# Patient Record
Sex: Female | Born: 1965
Health system: Southern US, Community
[De-identification: ages and names within clinical notes are randomized; demographics above are authoritative.]

## PROBLEM LIST (undated history)

## (undated) DIAGNOSIS — D259 Leiomyoma of uterus, unspecified: Secondary | ICD-10-CM

## (undated) DIAGNOSIS — N809 Endometriosis, unspecified: Secondary | ICD-10-CM

## (undated) DIAGNOSIS — A048 Other specified bacterial intestinal infections: Secondary | ICD-10-CM

## (undated) DIAGNOSIS — E785 Hyperlipidemia, unspecified: Secondary | ICD-10-CM

## (undated) DIAGNOSIS — M1990 Unspecified osteoarthritis, unspecified site: Secondary | ICD-10-CM

## (undated) DIAGNOSIS — K219 Gastro-esophageal reflux disease without esophagitis: Secondary | ICD-10-CM

## (undated) DIAGNOSIS — K635 Polyp of colon: Secondary | ICD-10-CM

## (undated) HISTORY — PX: LAPAROSCOPY: SHX197

## (undated) HISTORY — DX: Gastro-esophageal reflux disease without esophagitis: K21.9

## (undated) HISTORY — DX: Hyperlipidemia, unspecified: E78.5

## (undated) HISTORY — DX: Leiomyoma of uterus, unspecified: D25.9

## (undated) HISTORY — DX: Polyp of colon: K63.5

## (undated) HISTORY — PX: ABDOMINAL HYSTERECTOMY: SUR658

## (undated) HISTORY — DX: Endometriosis, unspecified: N80.9

## (undated) HISTORY — DX: Other specified bacterial intestinal infections: A04.8

## (undated) HISTORY — PX: TONSILLECTOMY: SUR1361

---

## 1998-01-11 ENCOUNTER — Emergency Department (HOSPITAL_COMMUNITY): Admission: EM | Admit: 1998-01-11 | Discharge: 1998-01-11 | Payer: Self-pay | Admitting: *Deleted

## 1999-12-26 ENCOUNTER — Other Ambulatory Visit: Admission: RE | Admit: 1999-12-26 | Discharge: 1999-12-26 | Payer: Self-pay | Admitting: *Deleted

## 2003-08-04 ENCOUNTER — Encounter: Admission: RE | Admit: 2003-08-04 | Discharge: 2003-08-04 | Payer: Self-pay | Admitting: Family Medicine

## 2005-07-30 ENCOUNTER — Encounter: Payer: Self-pay | Admitting: Emergency Medicine

## 2005-08-14 ENCOUNTER — Other Ambulatory Visit: Admission: RE | Admit: 2005-08-14 | Discharge: 2005-08-14 | Payer: Self-pay | Admitting: *Deleted

## 2006-09-22 ENCOUNTER — Encounter: Admission: RE | Admit: 2006-09-22 | Discharge: 2006-09-22 | Payer: Self-pay | Admitting: *Deleted

## 2006-10-07 ENCOUNTER — Other Ambulatory Visit: Admission: RE | Admit: 2006-10-07 | Discharge: 2006-10-07 | Payer: Self-pay | Admitting: *Deleted

## 2007-11-23 ENCOUNTER — Encounter: Admission: RE | Admit: 2007-11-23 | Discharge: 2007-11-23 | Payer: Self-pay | Admitting: Family Medicine

## 2008-07-28 ENCOUNTER — Other Ambulatory Visit: Admission: RE | Admit: 2008-07-28 | Discharge: 2008-07-28 | Payer: Self-pay | Admitting: Family Medicine

## 2009-02-15 ENCOUNTER — Encounter: Admission: RE | Admit: 2009-02-15 | Discharge: 2009-02-15 | Payer: Self-pay | Admitting: Family Medicine

## 2009-10-10 ENCOUNTER — Other Ambulatory Visit: Admission: RE | Admit: 2009-10-10 | Discharge: 2009-10-10 | Payer: Self-pay | Admitting: Family Medicine

## 2010-02-18 ENCOUNTER — Encounter: Admission: RE | Admit: 2010-02-18 | Discharge: 2010-02-18 | Payer: Self-pay | Admitting: Obstetrics & Gynecology

## 2010-04-22 ENCOUNTER — Encounter: Payer: Self-pay | Admitting: Family Medicine

## 2010-10-25 ENCOUNTER — Other Ambulatory Visit (HOSPITAL_COMMUNITY)
Admission: RE | Admit: 2010-10-25 | Discharge: 2010-10-25 | Disposition: A | Discharge status: Home / Self Care | Payer: 59 | Source: Ambulatory Visit | Attending: Family Medicine | Admitting: Family Medicine

## 2010-10-25 ENCOUNTER — Other Ambulatory Visit: Payer: Self-pay | Admitting: Family Medicine

## 2010-10-25 DIAGNOSIS — Z124 Encounter for screening for malignant neoplasm of cervix: Secondary | ICD-10-CM | POA: Insufficient documentation

## 2010-10-25 DIAGNOSIS — Z1159 Encounter for screening for other viral diseases: Secondary | ICD-10-CM | POA: Insufficient documentation

## 2011-02-28 ENCOUNTER — Other Ambulatory Visit: Payer: Self-pay | Admitting: Obstetrics & Gynecology

## 2011-02-28 DIAGNOSIS — Z1231 Encounter for screening mammogram for malignant neoplasm of breast: Secondary | ICD-10-CM

## 2011-03-07 ENCOUNTER — Ambulatory Visit
Admission: RE | Admit: 2011-03-07 | Discharge: 2011-03-07 | Disposition: A | Discharge status: Home / Self Care | Payer: 59 | Source: Ambulatory Visit | Attending: Obstetrics & Gynecology | Admitting: Obstetrics & Gynecology

## 2011-03-07 DIAGNOSIS — Z1231 Encounter for screening mammogram for malignant neoplasm of breast: Secondary | ICD-10-CM

## 2011-03-11 ENCOUNTER — Other Ambulatory Visit: Payer: Self-pay | Admitting: Obstetrics & Gynecology

## 2011-03-11 ENCOUNTER — Other Ambulatory Visit: Payer: Self-pay | Admitting: Family Medicine

## 2011-03-11 ENCOUNTER — Other Ambulatory Visit: Payer: Self-pay | Admitting: *Deleted

## 2011-03-11 DIAGNOSIS — R928 Other abnormal and inconclusive findings on diagnostic imaging of breast: Secondary | ICD-10-CM

## 2011-03-13 ENCOUNTER — Ambulatory Visit
Admission: RE | Admit: 2011-03-13 | Discharge: 2011-03-13 | Disposition: A | Discharge status: Home / Self Care | Payer: 59 | Source: Ambulatory Visit | Attending: Family Medicine | Admitting: Family Medicine

## 2011-03-13 DIAGNOSIS — R928 Other abnormal and inconclusive findings on diagnostic imaging of breast: Secondary | ICD-10-CM

## 2011-08-15 ENCOUNTER — Other Ambulatory Visit: Payer: Self-pay | Admitting: Family Medicine

## 2011-08-15 DIAGNOSIS — N6009 Solitary cyst of unspecified breast: Secondary | ICD-10-CM

## 2011-09-03 ENCOUNTER — Other Ambulatory Visit: Payer: 59

## 2011-11-21 ENCOUNTER — Ambulatory Visit
Admission: RE | Admit: 2011-11-21 | Discharge: 2011-11-21 | Disposition: A | Discharge status: Home / Self Care | Payer: 59 | Source: Ambulatory Visit | Attending: Family Medicine | Admitting: Family Medicine

## 2011-11-21 DIAGNOSIS — N6009 Solitary cyst of unspecified breast: Secondary | ICD-10-CM

## 2012-02-19 ENCOUNTER — Other Ambulatory Visit: Payer: Self-pay | Admitting: Gastroenterology

## 2012-06-03 ENCOUNTER — Other Ambulatory Visit: Payer: Self-pay | Admitting: Obstetrics & Gynecology

## 2012-06-03 ENCOUNTER — Other Ambulatory Visit: Payer: Self-pay | Admitting: Family Medicine

## 2012-06-03 DIAGNOSIS — N6002 Solitary cyst of left breast: Secondary | ICD-10-CM

## 2012-06-15 ENCOUNTER — Other Ambulatory Visit: Payer: 59

## 2012-06-17 ENCOUNTER — Ambulatory Visit
Admission: RE | Admit: 2012-06-17 | Discharge: 2012-06-17 | Disposition: A | Discharge status: Home / Self Care | Payer: 59 | Source: Ambulatory Visit | Attending: Family Medicine | Admitting: Family Medicine

## 2012-06-17 DIAGNOSIS — N6002 Solitary cyst of left breast: Secondary | ICD-10-CM

## 2014-03-31 IMAGING — MG MM DIAGNOSTIC BILATERAL
5 series · 5 of 5 positions shown · non-contrast
Comparison: [DATE] [DATE], [DATE], [DATE] [DATE], [DATE], [DATE] [DATE], [DATE],
[DATE] [DATE], [DATE]

CLINICAL DATA: Follow-up left breast

DIGITAL DIAGNOSTIC BILATERAL MAMMOGRAM WITH CAD AND LEFT BREAST
ULTRASOUND:

[R CC (1 of 2)]
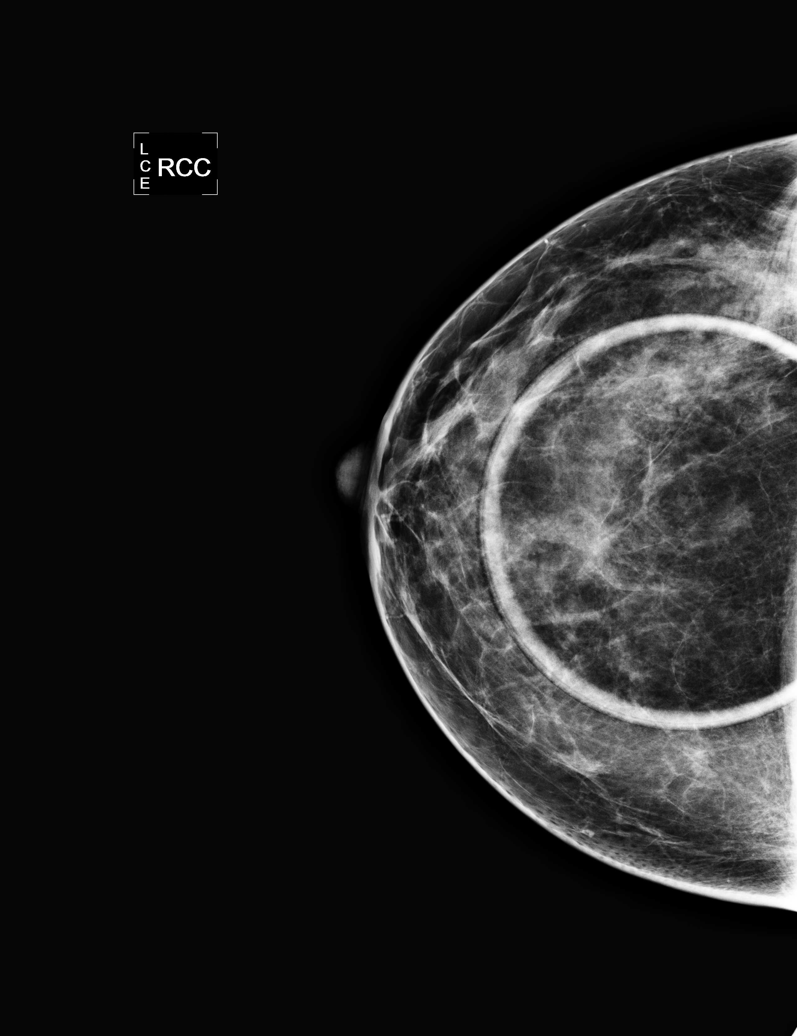

[R CC (2 of 2)]
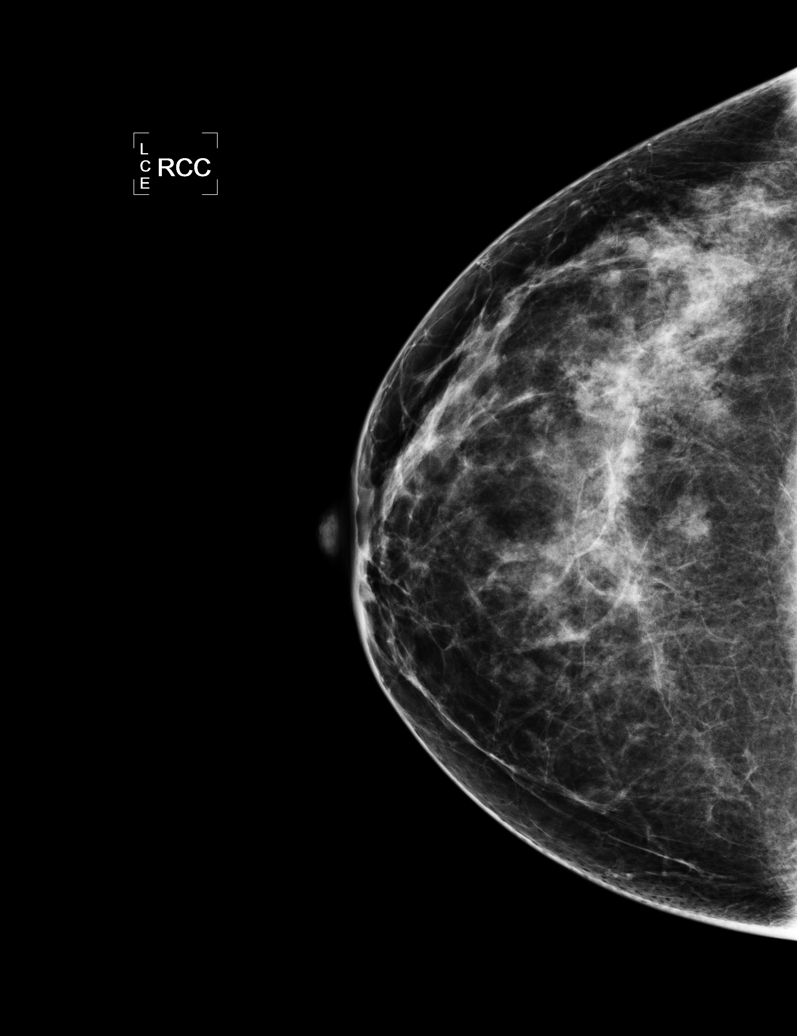

[L CC]
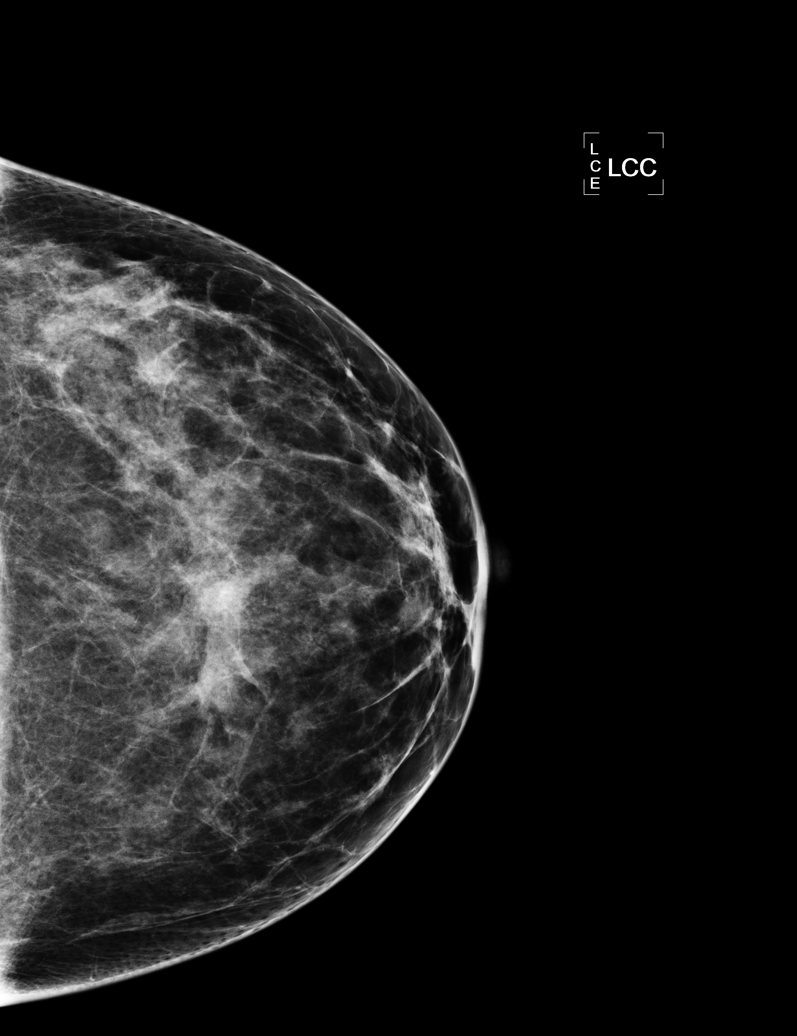

[L MLO]
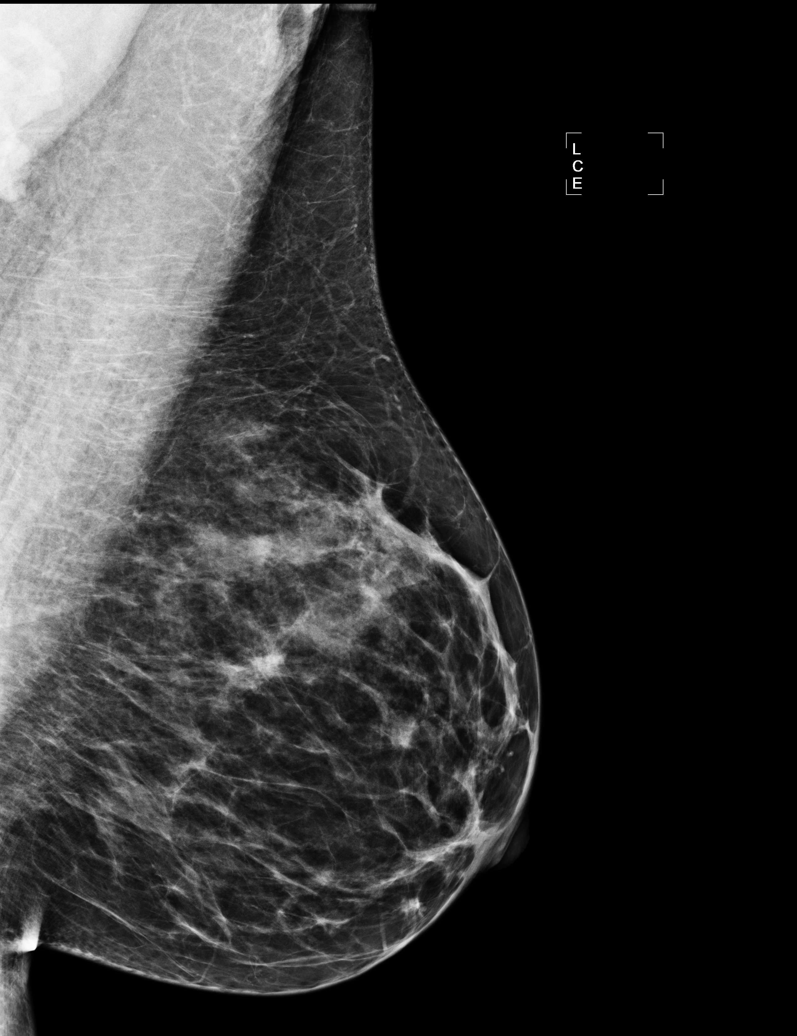

[R MLO]
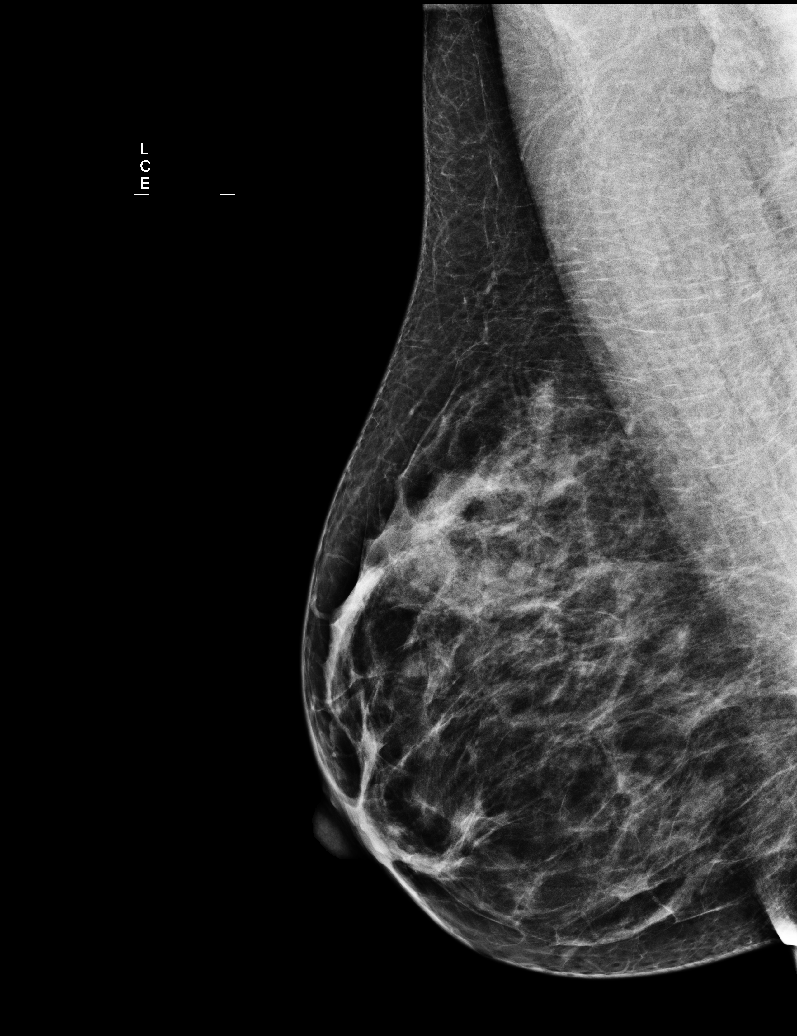

[5 of 5 positions shown; findings below may reference images not displayed]

FINDINGS: ACR Breast Density Category heterogeneously dense

CC and MLO views of bilateral breasts, spot compression right CC
view are submitted.  There is questioned asymmetry in central
posterior right breast which does not persist on spot compression
right CC view.  No suspicious abnormality is identified in both
breasts.

Mammographic images were processed with CAD.

Ultrasound is performed, showing a cluster of cysts at the left
breast 12 o'clock 4 cm from nipple measuring maximum of 0.8 cm
unchanged compared to prior ultrasound November 21, 2011 and smaller
compared prior ultrasound March 13, 2011
IMPRESSION: Probable benign findings

RECOMMENDATION:
Follow-up ultrasound left breast in February 2013.  At which time,
the patient would have had 2-year follow-up of this area.

I have discussed the findings and recommendations with the patient.
Results were also provided in writing at the conclusion of the
visit.

BI-RADS CATEGORY 3:  Probably benign finding(s) - short interval
follow-up suggested.

## 2014-03-31 IMAGING — US US BREAST*L*
1 series · 4 of 4 positions shown · non-contrast
Comparison: [DATE] [DATE], [DATE], [DATE] [DATE], [DATE], [DATE] [DATE], [DATE],
[DATE] [DATE], [DATE]

CLINICAL DATA: Follow-up left breast

DIGITAL DIAGNOSTIC BILATERAL MAMMOGRAM WITH CAD AND LEFT BREAST
ULTRASOUND:

[Series 1: us breast*left* · 4 of 4 slices shown]
[im 1/4]
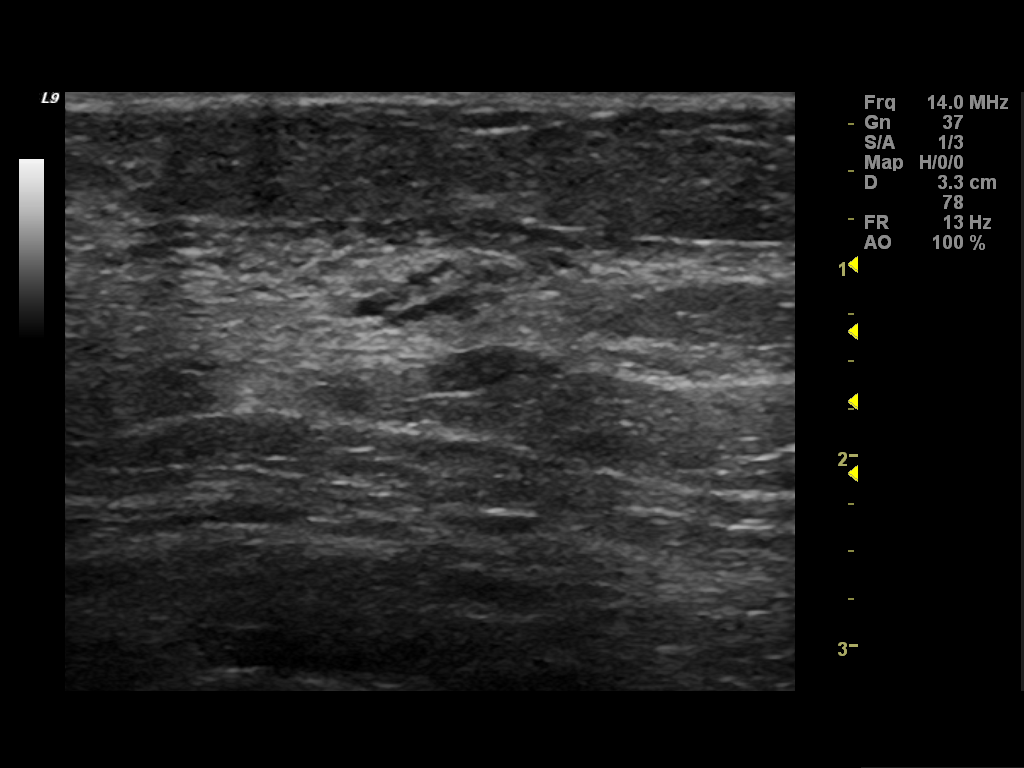
[im 2/4]
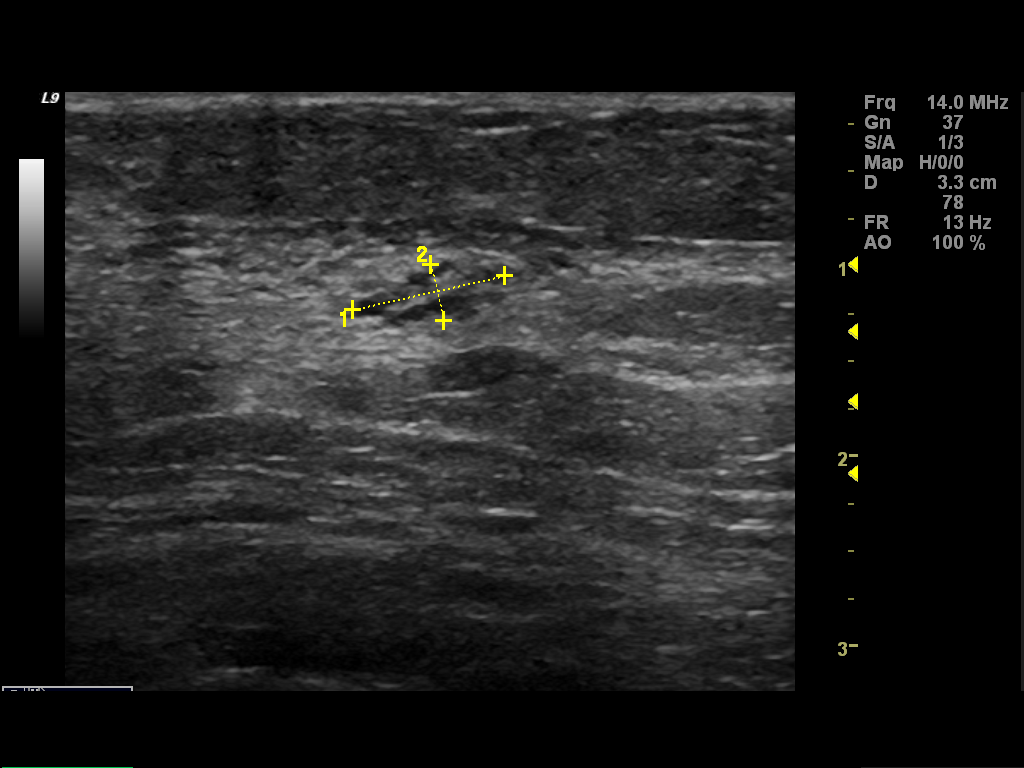
[im 3/4]
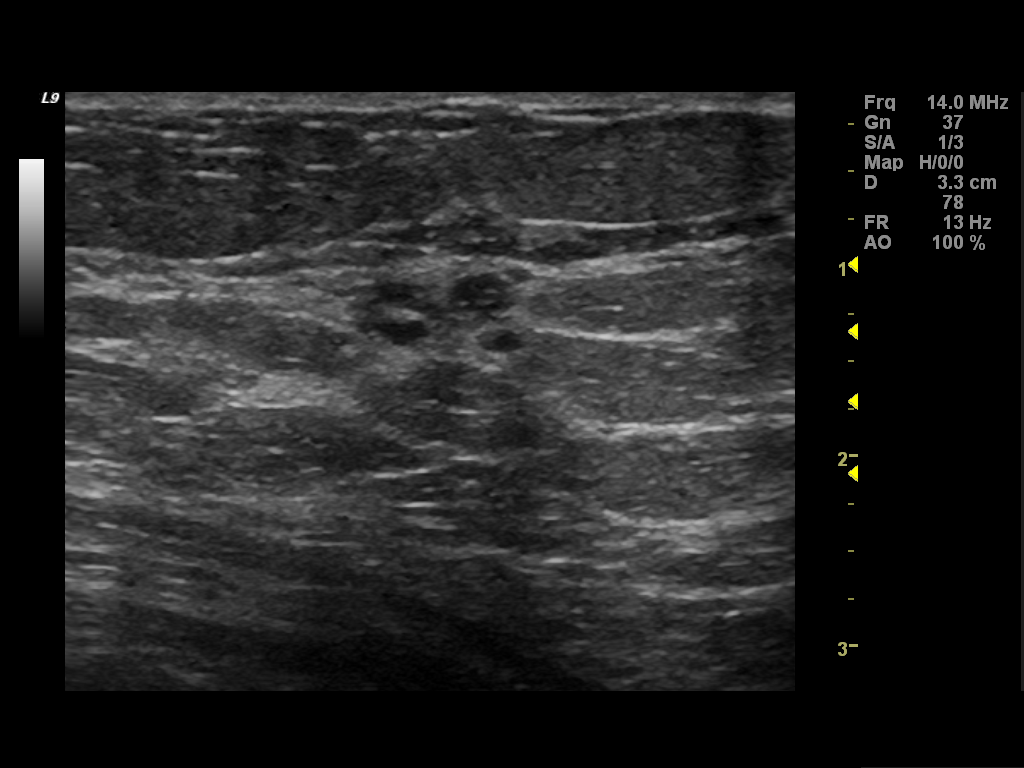
[im 4/4]
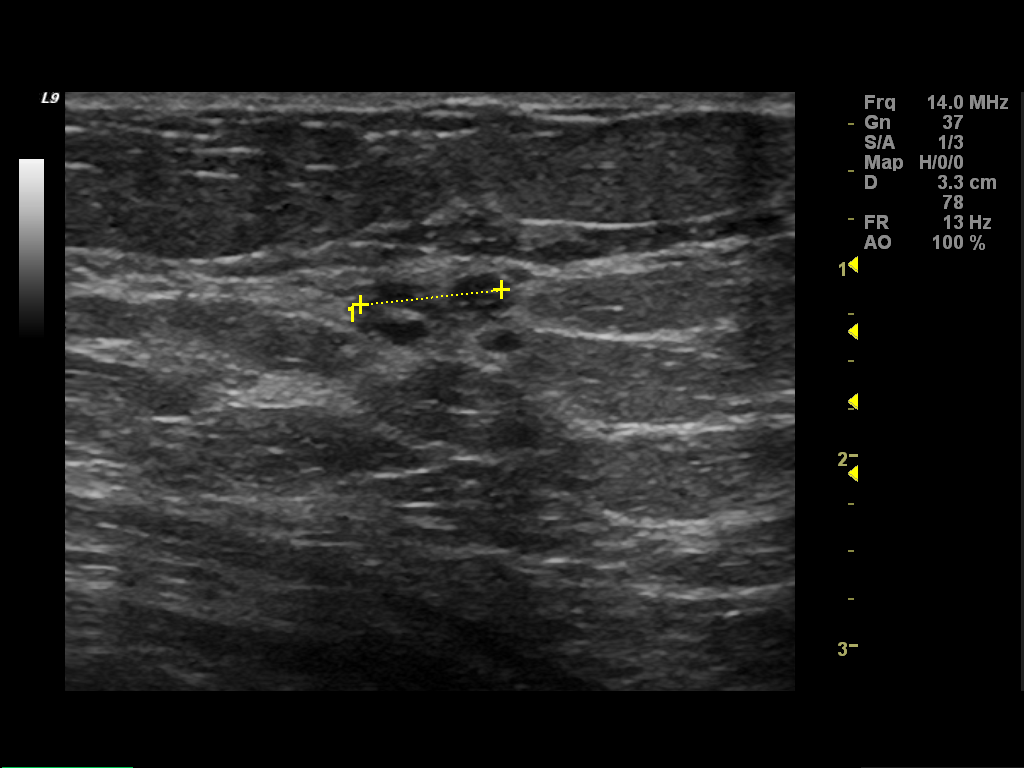

[4 of 4 positions shown; findings below may reference images not displayed]

FINDINGS: ACR Breast Density Category heterogeneously dense

CC and MLO views of bilateral breasts, spot compression right CC
view are submitted.  There is questioned asymmetry in central
posterior right breast which does not persist on spot compression
right CC view.  No suspicious abnormality is identified in both
breasts.

Mammographic images were processed with CAD.

Ultrasound is performed, showing a cluster of cysts at the left
breast 12 o'clock 4 cm from nipple measuring maximum of 0.8 cm
unchanged compared to prior ultrasound November 21, 2011 and smaller
compared prior ultrasound March 13, 2011
IMPRESSION: Probable benign findings

RECOMMENDATION:
Follow-up ultrasound left breast in February 2013.  At which time,
the patient would have had 2-year follow-up of this area.

I have discussed the findings and recommendations with the patient.
Results were also provided in writing at the conclusion of the
visit.

BI-RADS CATEGORY 3:  Probably benign finding(s) - short interval
follow-up suggested.

## 2017-06-18 ENCOUNTER — Encounter: Payer: Self-pay | Admitting: Physician Assistant

## 2017-07-14 ENCOUNTER — Encounter: Payer: Self-pay | Admitting: Physician Assistant

## 2017-07-14 ENCOUNTER — Ambulatory Visit (INDEPENDENT_AMBULATORY_CARE_PROVIDER_SITE_OTHER): Payer: BLUE CROSS/BLUE SHIELD | Admitting: Physician Assistant

## 2017-07-14 VITALS — BP 134/76 | HR 68 | Ht 62.5 in | Wt 204.0 lb

## 2017-07-14 DIAGNOSIS — Z8742 Personal history of other diseases of the female genital tract: Secondary | ICD-10-CM | POA: Diagnosis not present

## 2017-07-14 DIAGNOSIS — K219 Gastro-esophageal reflux disease without esophagitis: Secondary | ICD-10-CM

## 2017-07-14 DIAGNOSIS — Z86010 Personal history of colonic polyps: Secondary | ICD-10-CM | POA: Diagnosis not present

## 2017-07-14 DIAGNOSIS — Z1211 Encounter for screening for malignant neoplasm of colon: Secondary | ICD-10-CM

## 2017-07-14 DIAGNOSIS — Z800 Family history of malignant neoplasm of digestive organs: Secondary | ICD-10-CM | POA: Diagnosis not present

## 2017-07-14 MED ORDER — OMEPRAZOLE MAGNESIUM 20 MG PO TBEC: 40.0000 mg | DELAYED_RELEASE_TABLET | Freq: Every day | ORAL | 11 refills | Status: DC

## 2017-07-14 MED ORDER — NA SULFATE-K SULFATE-MG SULF 17.5-3.13-1.6 GM/177ML PO SOLN: 1.0000 | Freq: Once | ORAL | 0 refills | Status: AC

## 2017-07-14 NOTE — Patient Instructions (Addendum)
  If you are age 52 or younger, your body mass index should be between 19-25. Your Body mass index is 36.72 kg/m. If this is out of the aformentioned range listed, please consider follow up with your Primary Care Provider.   We have sent the following medications to your pharmacy for you to pick up at your convenience: [address], [place], [place].  1. Omeprazole 40 mg. 2. Suprep colonoscopy prep  We have provided you with anti-reflux information. You have been scheduled for an endoscopy and colonoscopy. Please follow the written instructions given to you at your visit today. Please pick up your prep supplies at the pharmacy within the next 1-3 days. If you use inhalers (even only as needed), please bring them with you on the day of your procedure. Your physician has requested that you go to www.startemmi.com and enter the access code given to you at your visit today. This web site gives a general overview about your procedure. However, you should still follow specific instructions given to you by our office regarding your preparation for the procedure.

## 2017-07-14 NOTE — Progress Notes (Signed)
Reviewed and agree with initial management plan. Awaiting records from [facility]. If there is no history of precancerous colon polyps a 10 year interval colonoscopy would be appropriate.   Pricilla Riffle. Fuller Plan, MD [facility]

## 2017-07-14 NOTE — Progress Notes (Signed)
Subjective:    Patient ID: [name], female    DOB: 25-Nov-1965, 52 y.o.   MRN: [id number]  HPI [name] is a pleasant 52 year old white female, new to GI today self-referred for evaluation of recent heartburn and reflux symptoms and to discuss follow-up colonoscopy. Patient had previously been seen by Dr. Teena Franco at [place] and says she had colonoscopy in 2013.  She was found to have a polyp, type unclear but believes she was told to follow-up in 5 years.  [name] She may have had one prior upper endoscopy many years ago done elsewhere. [name] history is pertinent for a paternal aunt with colon cancer deceased in her 14s. Patient has no current lower GI complaints, specifically no problems with changes in bowel habits diarrhea constipation melena or hematochezia. She has had problems with endometriosis and uterine fibroids.  She had an IUD placed in February to help with bleeding.  She had also been having some difficulty with headaches and was taking quite a bit of anti-inflammatories.  She says in March she began feeling ill with headache and nausea followed by pain in her left shoulder blade, and ongoing symptoms of heartburn and indigestion.  She says she has had the pain in the left shoulder blade off and on and it typically correlates with onset of reflux type symptoms.  She put herself on over-the-counter omeprazole 40 mg daily and says she is feeling better at this point but had several weeks of feeling miserable.  She is no longer having any nausea, has no complaints of dysphagia or odynophagia and no abdominal pain.  She also has stopped taking anti-inflammatories.  She is worried about her esophagus and all also about the left scapular and back pain in regards to her pancreas though she admits that this is been present off and on for several years.  Review of Systems Pertinent positive and negative review of systems were noted in the above HPI section.  All other review of systems was  otherwise negative.  Outpatient Encounter Medications as of 07/14/2017  Medication Sig  . aspirin EC 81 MG tablet Take 81 mg by mouth daily.  . Calcium Carbonate-Vit D-Min (CALCIUM 1200 PO) Take 1 tablet by mouth daily. 600 d  . Cholecalciferol (VITAMIN D-3) 5000 units TABS Take 1 tablet by mouth daily.  . cyanocobalamin 1000 MCG tablet Take 1,000 mcg by mouth daily.  Marland Kitchen levonorgestrel (MIRENA) 20 MCG/24HR IUD 1 each by Intrauterine route once.  . lovastatin (MEVACOR) 20 MG tablet Take 1 tablet by mouth daily.  . Multiple Vitamin (MULTIVITAMIN) tablet Take 1 tablet by mouth daily.  Marland Kitchen omeprazole (PRILOSEC OTC) 20 MG tablet Take 2 tablets (40 mg total) by mouth daily.  . Probiotic Product (PHILLIPS COLON HEALTH PO) Take 1 tablet by mouth daily.  . [DISCONTINUED] omeprazole (PRILOSEC OTC) 20 MG tablet Take 20 mg by mouth daily.  . Na Sulfate-K Sulfate-Mg Sulf 17.5-3.13-1.6 GM/177ML SOLN Take 1 kit by mouth once for 1 dose.   No facility-administered encounter medications on file as of 07/14/2017.    No Known Allergies Patient Active Problem List   Diagnosis Date Noted  . Hx of colonic polyp 07/14/2017  . Hx of endometriosis 07/14/2017   Social History   Socioeconomic History  . Marital status: Single    Spouse name: Not on file  . Number of children: 0  . Years of education: Not on file  . Highest education level: Not on file  Occupational History  .  Occupation: office admin  Social Needs  . Financial resource strain: Not on file  . Food insecurity:    Worry: Not on file    Inability: Not on file  . Transportation needs:    Medical: Not on file    Non-medical: Not on file  Tobacco Use  . Smoking status: Former Smoker    Types: Cigarettes    Last attempt to quit: 2005    Years since quitting: 14.2  . Smokeless tobacco: Never Used  Substance and Sexual Activity  . Alcohol use: Yes    Comment: occasional beer  . Drug use: Never  . Sexual activity: Not on file  Lifestyle    . Physical activity:    Days per week: Not on file    Minutes per session: Not on file  . Stress: Not on file  Relationships  . Social connections:    Talks on phone: Not on file    Gets together: Not on file    Attends religious service: Not on file    Active member of club or organization: Not on file    Attends meetings of clubs or organizations: Not on file    Relationship status: Not on file  . Intimate partner violence:    Fear of current or ex partner: Not on file    Emotionally abused: Not on file    Physically abused: Not on file    Forced sexual activity: Not on file  Other Topics Concern  . Not on file  Social History Narrative  . Not on file    [name]'s family history includes Clotting disorder in her brother and mother; Colon cancer in her paternal aunt; Heart disease in her maternal uncle and paternal uncle.      Objective:    Vitals:   07/14/17 0958  BP: 134/76  Pulse: 68    Physical Exam; well-developed white female in no acute distress, very pleasant blood pressure 134/76 pulse 68, height 5 foot 2, weight 204, BMI 36.7.  HEENT; nontraumatic normocephalic EOMI PERRLA sclera anicteric, Cardiovascular ;regular rate and rhythm with S1-S2 no murmur rub or gallop, Pulmonary ;clear bilaterally, Abdomen ;soft, nontender nondistended bowel sounds are active there is no palpable mass or hepatosplenomegaly, Rectal; exam not done, Extremities; no clubbing cyanosis or edema skin warm dry, Neuro psych; mood and affect appropriate       Assessment & Plan:   #74 52 year old white female with recent exacerbation of what sounds like intermittent previous GERD symptoms.  Patient has just had multiple weeks of heartburn indigestion and associated left scapular pain improved on omeprazole 40 mg p.o. Daily #2 personal history of colon polyps-over due for follow-up colonoscopy #3 family history of colon cancer in paternal aunt deceased in her 92s #4 history of  endometriosis and uterine fibroids  Plan; Antireflux regimen, patient was given a copy of the diet, we discussed antireflux precautions including elevation of the head of the bed 45 degrees and n.p.o. for 2-3 hours prior to bedtime. Send prescription for omeprazole 40 mg p.o. every morning AC breakfast Patient will be scheduled for upper endoscopy and colonoscopy with Dr. Fuller Plan.  Both procedures were discussed in detail with patient including indications risks and benefits and she is agreeable to proceed. Patient has signed a release and will obtain her prior colonoscopy and path reports from [facility].  Amy S Esterwood PA-C 07/14/2017   Cc: No ref. provider found

## 2017-09-10 ENCOUNTER — Encounter: Payer: Self-pay | Admitting: Gastroenterology

## 2017-09-17 ENCOUNTER — Telehealth: Payer: Self-pay | Admitting: *Deleted

## 2017-09-17 NOTE — Telephone Encounter (Signed)
Advised the patient the pharmacy faxed [place] a request for Prior authorization on the [employer].  I informed the patient she can pick up a sample this week at our office, front desk. She thanked me for the sample.

## 2017-09-24 ENCOUNTER — Encounter: Payer: BLUE CROSS/BLUE SHIELD | Admitting: Gastroenterology

## 2017-12-02 ENCOUNTER — Encounter: Payer: BLUE CROSS/BLUE SHIELD | Admitting: Gastroenterology

## 2019-03-17 DIAGNOSIS — N809 Endometriosis, unspecified: Secondary | ICD-10-CM | POA: Insufficient documentation

## 2019-03-17 DIAGNOSIS — Z832 Family history of diseases of the blood and blood-forming organs and certain disorders involving the immune mechanism: Secondary | ICD-10-CM | POA: Insufficient documentation

## 2019-03-17 DIAGNOSIS — D252 Subserosal leiomyoma of uterus: Secondary | ICD-10-CM | POA: Insufficient documentation

## 2019-03-17 DIAGNOSIS — G8929 Other chronic pain: Secondary | ICD-10-CM | POA: Insufficient documentation

## 2019-03-17 DIAGNOSIS — M159 Polyosteoarthritis, unspecified: Secondary | ICD-10-CM | POA: Insufficient documentation

## 2019-03-17 DIAGNOSIS — E669 Obesity, unspecified: Secondary | ICD-10-CM | POA: Insufficient documentation

## 2019-03-17 DIAGNOSIS — E785 Hyperlipidemia, unspecified: Secondary | ICD-10-CM | POA: Insufficient documentation

## 2019-07-13 DIAGNOSIS — D219 Benign neoplasm of connective and other soft tissue, unspecified: Secondary | ICD-10-CM | POA: Insufficient documentation

## 2019-12-18 DIAGNOSIS — Z30432 Encounter for removal of intrauterine contraceptive device: Secondary | ICD-10-CM | POA: Insufficient documentation

## 2020-11-05 DIAGNOSIS — D251 Intramural leiomyoma of uterus: Secondary | ICD-10-CM | POA: Insufficient documentation

## 2021-04-24 ENCOUNTER — Ambulatory Visit: Payer: Self-pay

## 2021-04-24 ENCOUNTER — Ambulatory Visit (INDEPENDENT_AMBULATORY_CARE_PROVIDER_SITE_OTHER): Payer: Commercial Managed Care - PPO | Admitting: Orthopaedic Surgery

## 2021-04-24 VITALS — Ht 62.5 in | Wt 209.0 lb

## 2021-04-24 DIAGNOSIS — M25551 Pain in right hip: Secondary | ICD-10-CM | POA: Diagnosis not present

## 2021-04-24 DIAGNOSIS — M1611 Unilateral primary osteoarthritis, right hip: Secondary | ICD-10-CM | POA: Insufficient documentation

## 2021-04-24 DIAGNOSIS — M5431 Sciatica, right side: Secondary | ICD-10-CM

## 2021-04-24 DIAGNOSIS — M1612 Unilateral primary osteoarthritis, left hip: Secondary | ICD-10-CM | POA: Insufficient documentation

## 2021-04-24 DIAGNOSIS — M25559 Pain in unspecified hip: Secondary | ICD-10-CM

## 2021-04-24 NOTE — Progress Notes (Signed)
Office Visit Note   Patient: [name]           Date of Birth: [birth year]           MRN: [id number] Visit Date: 04/24/2021              Requested by: Kathyrn Lass, [place],  [place] [address] PCP: Kathyrn Lass, MD   Assessment & Plan: Visit Diagnoses:  1. Hip pain   2. Sciatica, right side   3. Unilateral primary osteoarthritis, right hip   4. Unilateral primary osteoarthritis, left hip     Plan: I did share with her all of her x-rays and talked in length in detail about hip replacement surgery.  I discussed the risk and benefits of the surgery and talked about what to expect from an intraoperative and postoperative course.  We went over her x-rays and a hip replacement model and I gave her handout about hip replacements.  She does have her surgery scheduler's card.  She said that she would like to consider losing more weight and get further out from recovery from her hysterectomy surgery before considering replacements.  All question concerns were answered addressed.  If he gets to where she continues to fail conservative treatment I am happy to proceed with hip replacement surgery.  She will let us know.  Follow-Up Instructions: Return if symptoms worsen or fail to improve.   Orders:  Orders Placed This Encounter  Procedures   XR HIPS BILAT W OR W/O PELVIS 2V   XR Lumbar Spine 2-3 Views   No orders of the defined types were placed in this encounter.     Procedures: No procedures performed   Clinical Data: No additional findings.   Subjective: Chief Complaint  Patient presents with   Left Hip - Pain   Right Hip - Pain  The patient is a very pleasant 56 year old female who comes in today with significant hip pain with both of her hips and in the groin area on both sides.  She has been seen by another orthopedic surgeon for her hips before and did have some type of injection.  She was referred to me by someone she knows as relates to hip  replacement surgery.  She is also been dealing with low back pain more so to the right side.  This is been getting worse for many years now.  In October this past year she did have a hysterectomy for significant pelvic pain and that is helped some.  She does walk with a significant Trendelenburg gait.  She is not a diabetic.  Her BMI is 37.62.  She states that she would like to lose weight more so before considering hip replacement surgery.  I believe her right hip hurts worse than the left.  At this point it is detriment affecting her mobility, her quality of life and actives daily living.  She has continue to work on flexibility of her hips.  HPI  Review of Systems There is currently listed no headache, chest pain, shortness of breath, fever, chills, nausea, vomiting  Objective: Vital Signs: Ht 5' 2.5" (1.588 m)    Wt 209 lb (94.8 kg)    BMI 37.62 kg/m   Physical Exam She is alert and orient x3 and in no acute distress Ortho Exam Examination of both hips show the move smoothly and fluidly surprising with good range of motion but definitely pain in the groin with both hips.  There is  pain with flexion and extension of her lumbar spine and she does have pain in the posterior elements of her lumbar spine to the right side. Specialty Comments:  No specialty comments available.  Imaging: XR HIPS BILAT W OR W/O PELVIS 2V  Result Date: 04/24/2021 An AP pelvis and lateral both hips shows severe end-stage arthritis of both hips.  Both hips have bone-on-bone wear with complete loss of superior lateral joint space.  There are osteophytes around both hips and sclerotic changes in both femoral heads and acetabulum's.  Both hips have slight subluxed position with valgus femoral necks.  XR Lumbar Spine 2-3 Views  Result Date: 04/24/2021 2 views lumbar spine show degenerative changes and degenerative disc disease at multiple levels.    PMFS History: Patient Active Problem List   Diagnosis Date Noted    Unilateral primary osteoarthritis, right hip 04/24/2021   Unilateral primary osteoarthritis, left hip 04/24/2021   Hx of colonic polyp 07/14/2017   Hx of endometriosis 07/14/2017   Past Medical History:  Diagnosis Date   Colon polyps    Endometriosis    Fibroid, uterine    GERD (gastroesophageal reflux disease)    H. pylori infection    HLD (hyperlipidemia)     Family History  Problem Relation Age of Onset   Clotting disorder Mother    Clotting disorder Brother    Colon cancer Paternal Aunt    Heart disease Paternal Uncle    Heart disease Maternal Uncle     Past Surgical History:  Procedure Laterality Date   LAPAROSCOPY     x 2   TONSILLECTOMY     Social History   Occupational History   Occupation: office admin  Tobacco Use   Smoking status: Former    Types: Cigarettes    Quit date: 2005    Years since quitting: 18.0   Smokeless tobacco: Never  Substance and Sexual Activity   Alcohol use: Yes    Comment: occasional beer   Drug use: Never   Sexual activity: Not on file

## 2021-06-14 ENCOUNTER — Telehealth: Payer: Self-pay

## 2021-06-14 NOTE — Telephone Encounter (Signed)
Returned patient's call to discuss scheduling THA surgery in May.  She is requesting a refill of Mobic 15 mg.  She uses [facility]. ?

## 2021-06-14 NOTE — Telephone Encounter (Signed)
Please advise 

## 2021-06-17 ENCOUNTER — Other Ambulatory Visit: Payer: Self-pay | Admitting: Nurse Practitioner

## 2021-06-17 ENCOUNTER — Other Ambulatory Visit: Payer: Self-pay | Admitting: Physician Assistant

## 2021-06-17 DIAGNOSIS — Z1231 Encounter for screening mammogram for malignant neoplasm of breast: Secondary | ICD-10-CM

## 2021-06-17 MED ORDER — MELOXICAM 7.5 MG PO TABS: 7.5000 mg | ORAL_TABLET | Freq: Every day | ORAL | 0 refills | Status: DC

## 2021-06-18 ENCOUNTER — Telehealth: Payer: Self-pay

## 2021-06-18 ENCOUNTER — Other Ambulatory Visit: Payer: Self-pay | Admitting: Orthopaedic Surgery

## 2021-06-18 MED ORDER — MELOXICAM 15 MG PO TABS: 15.0000 mg | ORAL_TABLET | Freq: Every day | ORAL | 3 refills | Status: DC

## 2021-06-18 NOTE — Telephone Encounter (Signed)
Patient is inquiring about Mobic Rx.  She needs 15 mg.  That is what she has been taking.  She uses [facility]. [phone] ?

## 2021-06-28 ENCOUNTER — Other Ambulatory Visit: Payer: Self-pay | Admitting: Physician Assistant

## 2021-06-28 DIAGNOSIS — M1611 Unilateral primary osteoarthritis, right hip: Secondary | ICD-10-CM

## 2021-07-03 NOTE — Progress Notes (Addendum)
Anesthesia Review: ? ?PCP: Alyssa Fox,NP in [place]  ?Cardiologist : none  ?Chest x-ray : ?EKG : ?Echo : ?Stress test: ?Cardiac Cath :  ?Activity level: can do a flgiht of stairs wtihout difficulty  ?Sleep Study/ CPAP : none  ?Fasting Blood Sugar :      / Checks Blood Sugar -- times a day:   ?Blood Thinner/ Instructions /Last Dose: ?ASA / Instruction ?81 mg Aspirin s/ Last Dose :   ?PT was 25 minutes late for preop appt.   ?81 mg Aspirin  ?

## 2021-07-03 NOTE — Progress Notes (Signed)
DUE TO COVID-19 ONLY ONE VISITOR IS ALLOWED TO COME WITH YOU AND STAY IN THE WAITING ROOM ONLY DURING PRE OP AND PROCEDURE DAY OF SURGERY.  2 VISITOR  MAY VISIT WITH YOU AFTER SURGERY IN YOUR PRIVATE ROOM DURING VISITING HOURS ONLY! ?YOU MAY HAVE ONE PERSON SPEND THE NITE WITH YOU IN YOUR ROOM AFTER SURGERY.   ? ? Your procedure is scheduled on:  ?07/19/21  ? ? Report to [facility] Main  Entrance ? ? Report to admitting at    0815am             ?DO NOT BRING INSURANCE CARD, PICTURE ID OR WALLET DAY OF SURGERY.  ?  ? ? Call this number if you have problems the morning of surgery [phone]  ? ? REMEMBER: NO  SOLID FOODS , CANDY, GUM OR MINTS AFTER MIDNITE THE NITE BEFORE SURGERY .       Marland Kitchen CLEAR LIQUIDS UNTIL    0800am             DAY OF SURGERY.      PLEASE FINISH ENSURE DRINK PER SURGEON ORDER  WHICH NEEDS TO BE COMPLETED AT  0800am        MORNING OF SURGERY.   ? ? ? ? ?CLEAR LIQUID DIET ? ? ?Foods Allowed      ?WATER ?BLACK COFFEE ( SUGAR OK, NO MILK, CREAM OR CREAMER) REGULAR AND DECAF  ?TEA ( SUGAR OK NO MILK, CREAM, OR CREAMER) REGULAR AND DECAF  ?PLAIN JELLO ( NO RED)  ?FRUIT ICES ( NO RED, NO FRUIT PULP)  ?POPSICLES ( NO RED)  ?JUICE- APPLE, WHITE GRAPE AND WHITE CRANBERRY  ?SPORT DRINK LIKE GATORADE ( NO RED)  ?CLEAR BROTH ( VEGETABLE , CHICKEN OR BEEF)                                                               ? ?    ? ?BRUSH YOUR TEETH MORNING OF SURGERY AND RINSE YOUR MOUTH OUT, NO CHEWING GUM CANDY OR MINTS. ?  ? ? Take these medicines the morning of surgery with A SIP OF WATER:  ? ? ? ? ?DO NOT TAKE ANY DIABETIC MEDICATIONS DAY OF YOUR SURGERY ?                  ?            You may not have any metal on your body including hair pins and  ?            piercings  Do not wear jewelry, make-up, lotions, powders or perfumes, deodorant ?            Do not wear nail polish on your fingernails.   ?           IF YOU ARE A FEMALE AND WANT TO SHAVE UNDER ARMS OR LEGS PRIOR TO SURGERY YOU MUST DO SO  AT LEAST 48 HOURS PRIOR TO SURGERY.  ?            Men may shave face and neck. ? ? Do not bring valuables to the hospital. [facility] NOT ?            RESPONSIBLE   FOR VALUABLES. ? Contacts, dentures  or bridgework may not be worn into surgery. ? Leave suitcase in the car. After surgery it may be brought to your room. ? ?  ? Patients discharged the day of surgery will not be allowed to drive home. IF YOU ARE HAVING SURGERY AND GOING HOME THE SAME DAY, YOU MUST HAVE AN ADULT TO DRIVE YOU HOME AND BE WITH YOU FOR 24 HOURS. YOU MAY GO HOME BY TAXI OR UBER OR ORTHERWISE, BUT AN ADULT MUST ACCOMPANY YOU HOME AND STAY WITH YOU FOR 24 HOURS. ?  ? ?            Please read over the following fact sheets you were given: ?_____________________________________________________________________ ? ?[facility] - Preparing for Surgery ?Before surgery, you can play an important role.  Because skin is not sterile, your skin needs to be as free of germs as possible.  You can reduce the number of germs on your skin by washing with CHG (chlorahexidine gluconate) soap before surgery.  CHG is an antiseptic cleaner which kills germs and bonds with the skin to continue killing germs even after washing. ?Please DO NOT use if you have an allergy to CHG or antibacterial soaps.  If your skin becomes reddened/irritated stop using the CHG and inform your nurse when you arrive at Short Stay. ?Do not shave (including legs and underarms) for at least 48 hours prior to the first CHG shower.  You may shave your face/neck. ?Please follow these instructions carefully: ? 1.  Shower with CHG Soap the night before surgery and the  morning of Surgery. ? 2.  If you choose to wash your hair, wash your hair first as usual with your  normal  shampoo. ? 3.  After you shampoo, rinse your hair and body thoroughly to remove the  shampoo.                           4.  Use CHG as you would any other liquid soap.  You can apply chg directly  to the skin and wash  ?                      Gently with a scrungie or clean washcloth. ? 5.  Apply the CHG Soap to your body ONLY FROM THE NECK DOWN.   Do not use on face/ open      ?                     Wound or open sores. Avoid contact with eyes, ears mouth and genitals (private parts).  ?                     Production manager,  Genitals (private parts) with your normal soap. ?            6.  Wash thoroughly, paying special attention to the area where your surgery  will be performed. ? 7.  Thoroughly rinse your body with warm water from the neck down. ? 8.  DO NOT shower/wash with your normal soap after using and rinsing off  the CHG Soap. ?               9.  Pat yourself dry with a clean towel. ?           10.  Wear clean pajamas. ?           11.  Place clean  sheets on your bed the night of your first shower and do not  sleep with pets. ?Day of Surgery : ?Do not apply any lotions/deodorants the morning of surgery.  Please wear clean clothes to the hospital/surgery center. ? ?FAILURE TO FOLLOW THESE INSTRUCTIONS MAY RESULT IN THE CANCELLATION OF YOUR SURGERY ?PATIENT SIGNATURE_________________________________ ? ?NURSE SIGNATURE__________________________________ ? ?________________________________________________________________________  ? ? ?           ?

## 2021-07-08 ENCOUNTER — Other Ambulatory Visit: Payer: Self-pay

## 2021-07-08 ENCOUNTER — Encounter (HOSPITAL_COMMUNITY): Payer: Self-pay

## 2021-07-08 ENCOUNTER — Encounter (HOSPITAL_COMMUNITY)
Admission: RE | Admit: 2021-07-08 | Discharge: 2021-07-08 | Disposition: A | Discharge status: Home / Self Care | Payer: Commercial Managed Care - PPO | Source: Ambulatory Visit | Attending: Orthopaedic Surgery | Admitting: Orthopaedic Surgery

## 2021-07-08 VITALS — BP 150/86 | HR 106 | Temp 98.2°F | Resp 16 | Ht 62.5 in | Wt 204.0 lb

## 2021-07-08 DIAGNOSIS — M1611 Unilateral primary osteoarthritis, right hip: Secondary | ICD-10-CM | POA: Insufficient documentation

## 2021-07-08 DIAGNOSIS — Z01812 Encounter for preprocedural laboratory examination: Secondary | ICD-10-CM | POA: Insufficient documentation

## 2021-07-08 DIAGNOSIS — Z01818 Encounter for other preprocedural examination: Secondary | ICD-10-CM

## 2021-07-08 HISTORY — DX: Unspecified osteoarthritis, unspecified site: M19.90

## 2021-07-08 LAB — CBC
HCT: 42.1 % (ref 36.0–46.0)
Hemoglobin: 13.8 g/dL (ref 12.0–15.0)
MCH: 31.2 pg (ref 26.0–34.0)
MCHC: 32.8 g/dL (ref 30.0–36.0)
MCV: 95 fL (ref 80.0–100.0)
Platelets: 260 10*3/uL (ref 150–400)
RBC: 4.43 MIL/uL (ref 3.87–5.11)
RDW: 12.5 % (ref 11.5–15.5)
WBC: 6.4 10*3/uL (ref 4.0–10.5)
nRBC: 0 % (ref 0.0–0.2)

## 2021-07-08 LAB — TYPE AND SCREEN
ABO/RH(D): O POS
Antibody Screen: NEGATIVE

## 2021-07-08 LAB — SURGICAL PCR SCREEN
MRSA, PCR: NEGATIVE
Staphylococcus aureus: NEGATIVE

## 2021-07-09 ENCOUNTER — Telehealth: Payer: Self-pay

## 2021-07-09 NOTE — Telephone Encounter (Signed)
FYI - I returned patient's call regarding questions prior to surgery.  She wants you to know she has worsened left hip and lower back pain as well as pulling, popping and cracking.  I mentioned she might get a left hip injection but she declined as she did not like the one she got with the right hip. ?

## 2021-07-18 NOTE — H&P (Signed)
TOTAL HIP ADMISSION H&P ? ?Patient is admitted for right total hip arthroplasty. ? ?Subjective: ? ?Chief Complaint: right hip pain ? ?HPI: MIKYA DON, 56 y.o. female, has a history of pain and functional disability in the right hip(s) due to arthritis and patient has failed non-surgical conservative treatments for greater than 12 weeks to include NSAID's and/or analgesics, use of assistive devices, weight reduction as appropriate, and activity modification.  Onset of symptoms was gradual starting 3 years ago with gradually worsening course since that time.The patient noted no past surgery on the right hip(s).  Patient currently rates pain in the right hip at 10 out of 10 with activity. Patient has night pain, worsening of pain with activity and weight bearing, trendelenberg gait, pain that interfers with activities of daily living, and pain with passive range of motion. Patient has evidence of subchondral sclerosis, periarticular osteophytes, and joint space narrowing by imaging studies. This condition presents safety issues increasing the risk of falls.  There is no current active infection. ? ?Patient Active Problem List  ? Diagnosis Date Noted  ? Unilateral primary osteoarthritis, right hip 04/24/2021  ? Unilateral primary osteoarthritis, left hip 04/24/2021  ? Hx of colonic polyp 07/14/2017  ? Hx of endometriosis 07/14/2017  ? ?Past Medical History:  ?Diagnosis Date  ? Arthritis   ? Colon polyps   ? Endometriosis   ? Fibroid, uterine   ? GERD (gastroesophageal reflux disease)   ? H. pylori infection   ? HLD (hyperlipidemia)   ?  ?Past Surgical History:  ?Procedure Laterality Date  ? ABDOMINAL HYSTERECTOMY    ? LAPAROSCOPY    ? x 2  ? TONSILLECTOMY    ?  ?No current facility-administered medications for this encounter.  ? ?Current Outpatient Medications  ?Medication Sig Dispense Refill Last Dose  ? acetaminophen (TYLENOL) 500 MG tablet Take 1,000 mg by mouth in the morning, at noon, and at bedtime.     ?  CRANBERRY-VITAMIN C PO Take 1 capsule by mouth daily.     ? lovastatin (MEVACOR) 20 MG tablet Take 20 mg by mouth every evening.  0   ? meloxicam (MOBIC) 15 MG tablet Take 1 tablet (15 mg total) by mouth daily. 30 tablet 3   ? Multiple Vitamin (MULTIVITAMIN) tablet Take 1 tablet by mouth daily.     ? OVER THE COUNTER MEDICATION Take 1 capsule by mouth daily. Circulation and Vein Support     ? OVER THE COUNTER MEDICATION Take 1 capsule by mouth daily. Complete Joint Effort     ? Probiotic Product (PHILLIPS COLON HEALTH PO) Take 1 tablet by mouth daily.     ? PSYLLIUM PO Take 1 capsule by mouth daily.     ? zinc gluconate 50 MG tablet Take 50 mg by mouth daily.     ? omeprazole (PRILOSEC OTC) 20 MG tablet Take 2 tablets (40 mg total) by mouth daily. (Patient not taking: Reported on 07/05/2021) 30 tablet 11 Not Taking  ? ?No Known Allergies  ?Social History  ? ?Tobacco Use  ? Smoking status: Former  ?  Types: Cigarettes  ?  Quit date: 2005  ?  Years since quitting: 18.3  ? Smokeless tobacco: Never  ?Substance Use Topics  ? Alcohol use: Yes  ?  Comment: daily beer  ?  ?Family History  ?Problem Relation Age of Onset  ? Clotting disorder Mother   ? Clotting disorder Brother   ? Colon cancer Paternal Aunt   ? Heart disease  Paternal Uncle   ? Heart disease Maternal Uncle   ?  ? ?Review of Systems  ?Musculoskeletal:  Positive for gait problem.  ?All other systems reviewed and are negative. ? ?Objective: ? ?Physical Exam ?Vitals reviewed.  ?Constitutional:   ?   Appearance: Normal appearance.  ?HENT:  ?   Head: Normocephalic and atraumatic.  ?Eyes:  ?   Extraocular Movements: Extraocular movements intact.  ?   Pupils: Pupils are equal, round, and reactive to light.  ?Cardiovascular:  ?   Rate and Rhythm: Normal rate and regular rhythm.  ?   Pulses: Normal pulses.  ?Pulmonary:  ?   Effort: Pulmonary effort is normal.  ?   Breath sounds: Normal breath sounds.  ?Abdominal:  ?   Palpations: Abdomen is soft.  ?Musculoskeletal:  ?    Cervical back: Normal range of motion and neck supple.  ?   Right hip: Tenderness and bony tenderness present. Decreased range of motion. Decreased strength.  ?Neurological:  ?   Mental Status: She is alert and oriented to person, place, and time.  ?Psychiatric:     ?   Behavior: Behavior normal.  ? ? ?Vital signs in last 24 hours: ?  ? ?Labs: ? ? ?Estimated body mass index is 36.72 kg/m? as calculated from the following: ?  Height as of 07/08/21: 5' 2.5" (1.588 m). ?  Weight as of 07/08/21: 92.5 kg. ? ? ?Imaging Review ?Plain radiographs demonstrate severe degenerative joint disease of the right hip(s). The bone quality appears to be excellent for age and reported activity level. ? ? ? ? ? ?Assessment/Plan: ? ?End stage arthritis, right hip(s) ? ?The patient history, physical examination, clinical judgement of the provider and imaging studies are consistent with end stage degenerative joint disease of the right hip(s) and total hip arthroplasty is deemed medically necessary. The treatment options including medical management, injection therapy, arthroscopy and arthroplasty were discussed at length. The risks and benefits of total hip arthroplasty were presented and reviewed. The risks due to aseptic loosening, infection, stiffness, dislocation/subluxation,  thromboembolic complications and other imponderables were discussed.  The patient acknowledged the explanation, agreed to proceed with the plan and consent was signed. Patient is being admitted for inpatient treatment for surgery, pain control, PT, OT, prophylactic antibiotics, VTE prophylaxis, progressive ambulation and ADL's and discharge planning.The patient is planning to be discharged home with home health services ? ? ? ?

## 2021-07-19 ENCOUNTER — Other Ambulatory Visit: Payer: Self-pay

## 2021-07-19 ENCOUNTER — Observation Stay (HOSPITAL_COMMUNITY): Payer: Commercial Managed Care - PPO

## 2021-07-19 ENCOUNTER — Encounter (HOSPITAL_COMMUNITY)
Admission: RE | Disposition: A | Discharge status: Home Health | Payer: Self-pay | Source: Home / Self Care | Attending: Orthopaedic Surgery

## 2021-07-19 ENCOUNTER — Observation Stay (HOSPITAL_COMMUNITY)
Admission: RE | Admit: 2021-07-19 | Discharge: 2021-07-20 | Disposition: A | Discharge status: Home Health | Payer: Commercial Managed Care - PPO | Attending: Orthopaedic Surgery | Admitting: Orthopaedic Surgery

## 2021-07-19 ENCOUNTER — Ambulatory Visit (HOSPITAL_COMMUNITY): Payer: Commercial Managed Care - PPO

## 2021-07-19 ENCOUNTER — Ambulatory Visit (HOSPITAL_BASED_OUTPATIENT_CLINIC_OR_DEPARTMENT_OTHER): Payer: Commercial Managed Care - PPO | Admitting: Certified Registered"

## 2021-07-19 ENCOUNTER — Encounter (HOSPITAL_COMMUNITY): Payer: Self-pay | Admitting: Orthopaedic Surgery

## 2021-07-19 ENCOUNTER — Ambulatory Visit (HOSPITAL_COMMUNITY): Payer: Commercial Managed Care - PPO | Admitting: Certified Registered"

## 2021-07-19 DIAGNOSIS — Z87891 Personal history of nicotine dependence: Secondary | ICD-10-CM | POA: Diagnosis not present

## 2021-07-19 DIAGNOSIS — M1611 Unilateral primary osteoarthritis, right hip: Secondary | ICD-10-CM

## 2021-07-19 DIAGNOSIS — Z96641 Presence of right artificial hip joint: Secondary | ICD-10-CM

## 2021-07-19 HISTORY — PX: TOTAL HIP ARTHROPLASTY: SHX124

## 2021-07-19 LAB — ABO/RH: ABO/RH(D): O POS

## 2021-07-19 SURGERY — ARTHROPLASTY, HIP, TOTAL, ANTERIOR APPROACH
Anesthesia: Spinal | Site: Hip | Laterality: Right

## 2021-07-19 MED ORDER — PHENYLEPHRINE 80 MCG/ML (10ML) SYRINGE FOR IV PUSH (FOR BLOOD PRESSURE SUPPORT)
PREFILLED_SYRINGE | INTRAVENOUS | Status: DC | PRN
Start: 2021-07-19 — End: 2021-07-19
  Administered 2021-07-19: 40 ug via INTRAVENOUS
  Administered 2021-07-19 (×4): 80 ug via INTRAVENOUS

## 2021-07-19 MED ORDER — OXYCODONE HCL 5 MG PO TABS
5.0000 mg | ORAL_TABLET | ORAL | Status: DC | PRN
  Administered 2021-07-19: 10 mg via ORAL
  Administered 2021-07-19: 5 mg via ORAL
  Administered 2021-07-20 (×2): 10 mg via ORAL
  Filled 2021-07-19: qty 2
  Filled 2021-07-19: qty 1

## 2021-07-19 MED ORDER — PANTOPRAZOLE SODIUM 40 MG PO TBEC
40.0000 mg | DELAYED_RELEASE_TABLET | Freq: Every day | ORAL | Status: DC
  Administered 2021-07-20: 40 mg via ORAL
  Filled 2021-07-19: qty 1

## 2021-07-19 MED ORDER — PROPOFOL 10 MG/ML IV BOLUS
INTRAVENOUS | Status: AC
  Filled 2021-07-19: qty 20

## 2021-07-19 MED ORDER — FENTANYL CITRATE (PF) 100 MCG/2ML IJ SOLN
INTRAMUSCULAR | Status: DC | PRN
  Administered 2021-07-19: 50 ug via INTRAVENOUS
  Administered 2021-07-19 (×2): 25 ug via INTRAVENOUS

## 2021-07-19 MED ORDER — METHOCARBAMOL 500 MG IVPB - SIMPLE MED
500.0000 mg | Freq: Four times a day (QID) | INTRAVENOUS | Status: DC | PRN
  Filled 2021-07-19: qty 50

## 2021-07-19 MED ORDER — BUPIVACAINE IN DEXTROSE 0.75-8.25 % IT SOLN
INTRATHECAL | Status: DC | PRN
  Administered 2021-07-19: 1.8 mL via INTRATHECAL

## 2021-07-19 MED ORDER — TRANEXAMIC ACID-NACL 1000-0.7 MG/100ML-% IV SOLN
1000.0000 mg | INTRAVENOUS | Status: AC
  Administered 2021-07-19: 1000 mg via INTRAVENOUS
  Filled 2021-07-19: qty 100

## 2021-07-19 MED ORDER — PROPOFOL 10 MG/ML IV BOLUS
INTRAVENOUS | Status: DC | PRN
  Administered 2021-07-19: 10 mg via INTRAVENOUS
  Administered 2021-07-19: 20 mg via INTRAVENOUS

## 2021-07-19 MED ORDER — OXYCODONE HCL 5 MG PO TABS
10.0000 mg | ORAL_TABLET | ORAL | Status: DC | PRN
  Filled 2021-07-19 (×2): qty 2

## 2021-07-19 MED ORDER — ORAL CARE MOUTH RINSE: 15.0000 mL | Freq: Once | OROMUCOSAL | Status: DC

## 2021-07-19 MED ORDER — SODIUM CHLORIDE 0.9 % IV SOLN: INTRAVENOUS | Status: DC

## 2021-07-19 MED ORDER — FENTANYL CITRATE PF 50 MCG/ML IJ SOSY: 25.0000 ug | PREFILLED_SYRINGE | INTRAMUSCULAR | Status: DC | PRN

## 2021-07-19 MED ORDER — PHENYLEPHRINE HCL-NACL 20-0.9 MG/250ML-% IV SOLN
INTRAVENOUS | Status: DC | PRN
Start: 2021-07-19 — End: 2021-07-19
  Administered 2021-07-19: 20 ug/min via INTRAVENOUS

## 2021-07-19 MED ORDER — ASPIRIN 81 MG PO CHEW: 81.0000 mg | CHEWABLE_TABLET | Freq: Two times a day (BID) | ORAL | Status: DC

## 2021-07-19 MED ORDER — DEXAMETHASONE SODIUM PHOSPHATE 10 MG/ML IJ SOLN
INTRAMUSCULAR | Status: AC
  Filled 2021-07-19: qty 1

## 2021-07-19 MED ORDER — METHOCARBAMOL 500 MG PO TABS
500.0000 mg | ORAL_TABLET | Freq: Four times a day (QID) | ORAL | Status: DC | PRN
  Administered 2021-07-19 (×2): 500 mg via ORAL
  Filled 2021-07-19 (×3): qty 1

## 2021-07-19 MED ORDER — CHLORHEXIDINE GLUCONATE 0.12 % MT SOLN: 15.0000 mL | Freq: Once | OROMUCOSAL | Status: DC

## 2021-07-19 MED ORDER — 0.9 % SODIUM CHLORIDE (POUR BTL) OPTIME
TOPICAL | Status: DC | PRN
  Administered 2021-07-19: 1000 mL

## 2021-07-19 MED ORDER — METOCLOPRAMIDE HCL 5 MG/ML IJ SOLN: 5.0000 mg | Freq: Three times a day (TID) | INTRAMUSCULAR | Status: DC | PRN

## 2021-07-19 MED ORDER — MIDAZOLAM HCL 2 MG/2ML IJ SOLN
INTRAMUSCULAR | Status: DC | PRN
Start: 2021-07-19 — End: 2021-07-19
  Administered 2021-07-19: 2 mg via INTRAVENOUS

## 2021-07-19 MED ORDER — CEFAZOLIN SODIUM-DEXTROSE 2-4 GM/100ML-% IV SOLN
2.0000 g | INTRAVENOUS | Status: AC
  Administered 2021-07-19: 2 g via INTRAVENOUS
  Filled 2021-07-19: qty 100

## 2021-07-19 MED ORDER — LACTATED RINGERS IV SOLN: INTRAVENOUS | Status: DC

## 2021-07-19 MED ORDER — PROPOFOL 500 MG/50ML IV EMUL
INTRAVENOUS | Status: DC | PRN
  Administered 2021-07-19: 75 ug/kg/min via INTRAVENOUS

## 2021-07-19 MED ORDER — ONDANSETRON HCL 4 MG PO TABS
4.0000 mg | ORAL_TABLET | Freq: Four times a day (QID) | ORAL | Status: DC | PRN
Start: 2021-07-19 — End: 2021-07-20

## 2021-07-19 MED ORDER — APIXABAN 2.5 MG PO TABS
2.5000 mg | ORAL_TABLET | Freq: Two times a day (BID) | ORAL | Status: DC
  Administered 2021-07-20: 2.5 mg via ORAL
  Filled 2021-07-19: qty 1

## 2021-07-19 MED ORDER — LIDOCAINE HCL (PF) 2 % IJ SOLN
INTRAMUSCULAR | Status: AC
  Filled 2021-07-19: qty 5

## 2021-07-19 MED ORDER — POVIDONE-IODINE 10 % EX SWAB
2.0000 | Freq: Once | CUTANEOUS | Status: DC
Start: 2021-07-19 — End: 2021-07-19

## 2021-07-19 MED ORDER — SODIUM CHLORIDE 0.9 % IR SOLN
Status: DC | PRN
Start: 2021-07-19 — End: 2021-07-19
  Administered 2021-07-19: 1000 mL

## 2021-07-19 MED ORDER — CEFAZOLIN SODIUM-DEXTROSE 1-4 GM/50ML-% IV SOLN
1.0000 g | Freq: Four times a day (QID) | INTRAVENOUS | Status: AC
  Administered 2021-07-19 (×2): 1 g via INTRAVENOUS
  Filled 2021-07-19 (×2): qty 50

## 2021-07-19 MED ORDER — ALUM & MAG HYDROXIDE-SIMETH 200-200-20 MG/5ML PO SUSP: 30.0000 mL | ORAL | Status: DC | PRN

## 2021-07-19 MED ORDER — DEXAMETHASONE SODIUM PHOSPHATE 10 MG/ML IJ SOLN
INTRAMUSCULAR | Status: DC | PRN
  Administered 2021-07-19: 4 mg via INTRAVENOUS

## 2021-07-19 MED ORDER — ONDANSETRON HCL 4 MG/2ML IJ SOLN: 4.0000 mg | Freq: Four times a day (QID) | INTRAMUSCULAR | Status: DC | PRN

## 2021-07-19 MED ORDER — MENTHOL 3 MG MT LOZG: 1.0000 | LOZENGE | OROMUCOSAL | Status: DC | PRN

## 2021-07-19 MED ORDER — PHENOL 1.4 % MT LIQD: 1.0000 | OROMUCOSAL | Status: DC | PRN

## 2021-07-19 MED ORDER — DOCUSATE SODIUM 100 MG PO CAPS
100.0000 mg | ORAL_CAPSULE | Freq: Two times a day (BID) | ORAL | Status: DC
  Administered 2021-07-20: 100 mg via ORAL
  Filled 2021-07-19: qty 1

## 2021-07-19 MED ORDER — ONDANSETRON HCL 4 MG/2ML IJ SOLN
INTRAMUSCULAR | Status: DC | PRN
  Administered 2021-07-19: 4 mg via INTRAVENOUS

## 2021-07-19 MED ORDER — METOCLOPRAMIDE HCL 5 MG PO TABS: 5.0000 mg | ORAL_TABLET | Freq: Three times a day (TID) | ORAL | Status: DC | PRN

## 2021-07-19 MED ORDER — PROPOFOL 1000 MG/100ML IV EMUL
INTRAVENOUS | Status: AC
  Filled 2021-07-19: qty 100

## 2021-07-19 MED ORDER — AMISULPRIDE (ANTIEMETIC) 5 MG/2ML IV SOLN: 10.0000 mg | Freq: Once | INTRAVENOUS | Status: DC | PRN

## 2021-07-19 MED ORDER — MIDAZOLAM HCL 2 MG/2ML IJ SOLN
INTRAMUSCULAR | Status: AC
  Filled 2021-07-19: qty 2

## 2021-07-19 MED ORDER — FENTANYL CITRATE (PF) 100 MCG/2ML IJ SOLN
INTRAMUSCULAR | Status: AC
  Filled 2021-07-19: qty 2

## 2021-07-19 MED ORDER — ONDANSETRON HCL 4 MG/2ML IJ SOLN
INTRAMUSCULAR | Status: AC
  Filled 2021-07-19: qty 2

## 2021-07-19 MED ORDER — HYDROMORPHONE HCL 1 MG/ML IJ SOLN: 0.5000 mg | INTRAMUSCULAR | Status: DC | PRN

## 2021-07-19 MED ORDER — ACETAMINOPHEN 325 MG PO TABS
325.0000 mg | ORAL_TABLET | Freq: Four times a day (QID) | ORAL | Status: DC | PRN
  Administered 2021-07-19: 650 mg via ORAL
  Filled 2021-07-19 (×2): qty 2

## 2021-07-19 SURGICAL SUPPLY — 41 items
BAG COUNTER SPONGE SURGICOUNT (BAG) ×2 IMPLANT
BAG ZIPLOCK 12X15 (MISCELLANEOUS) IMPLANT
BENZOIN TINCTURE PRP APPL 2/3 (GAUZE/BANDAGES/DRESSINGS) IMPLANT
BLADE SAW SGTL 18X1.27X75 (BLADE) ×2 IMPLANT
COVER PERINEAL POST (MISCELLANEOUS) ×2 IMPLANT
COVER SURGICAL LIGHT HANDLE (MISCELLANEOUS) ×2 IMPLANT
CUP SECTOR GRIPTON 50MM (Cup) ×1 IMPLANT
DRAPE FOOT SWITCH (DRAPES) ×2 IMPLANT
DRAPE STERI IOBAN 125X83 (DRAPES) ×2 IMPLANT
DRAPE U-SHAPE 47X51 STRL (DRAPES) ×4 IMPLANT
DRSG AQUACEL AG ADV 3.5X10 (GAUZE/BANDAGES/DRESSINGS) ×2 IMPLANT
DURAPREP 26ML APPLICATOR (WOUND CARE) ×2 IMPLANT
ELECT REM PT RETURN 15FT ADLT (MISCELLANEOUS) ×2 IMPLANT
GAUZE XEROFORM 1X8 LF (GAUZE/BANDAGES/DRESSINGS) ×2 IMPLANT
GLOVE BIO SURGEON STRL SZ7.5 (GLOVE) ×2 IMPLANT
GLOVE BIOGEL PI IND STRL 8 (GLOVE) ×2 IMPLANT
GLOVE BIOGEL PI INDICATOR 8 (GLOVE) ×2
GLOVE ECLIPSE 8.0 STRL XLNG CF (GLOVE) ×2 IMPLANT
GOWN STRL REUS W/ TWL XL LVL3 (GOWN DISPOSABLE) ×2 IMPLANT
GOWN STRL REUS W/TWL XL LVL3 (GOWN DISPOSABLE) ×4
HANDPIECE INTERPULSE COAX TIP (DISPOSABLE) ×2
HEAD FEMORAL 32 CERAMIC (Hips) ×1 IMPLANT
HOLDER FOLEY CATH W/STRAP (MISCELLANEOUS) ×2 IMPLANT
KIT TURNOVER KIT A (KITS) IMPLANT
LINER ACETABULAR 32X50 (Liner) ×1 IMPLANT
PACK ANTERIOR HIP CUSTOM (KITS) ×2 IMPLANT
SCREW 6.5MMX25MM (Screw) ×1 IMPLANT
SET HNDPC FAN SPRY TIP SCT (DISPOSABLE) ×1 IMPLANT
SPONGE T-LAP 18X18 ~~LOC~~+RFID (SPONGE) ×6 IMPLANT
STAPLER VISISTAT 35W (STAPLE) IMPLANT
STEM FEM ACTIS HIGH SZ2 (Stem) ×1 IMPLANT
STRIP CLOSURE SKIN 1/2X4 (GAUZE/BANDAGES/DRESSINGS) IMPLANT
SUT ETHIBOND NAB CT1 #1 30IN (SUTURE) ×2 IMPLANT
SUT ETHILON 2 0 PS N (SUTURE) IMPLANT
SUT MNCRL AB 4-0 PS2 18 (SUTURE) IMPLANT
SUT VIC AB 0 CT1 36 (SUTURE) ×2 IMPLANT
SUT VIC AB 1 CT1 36 (SUTURE) ×2 IMPLANT
SUT VIC AB 2-0 CT1 27 (SUTURE) ×4
SUT VIC AB 2-0 CT1 TAPERPNT 27 (SUTURE) ×2 IMPLANT
TRAY FOLEY MTR SLVR 16FR STAT (SET/KITS/TRAYS/PACK) IMPLANT
YANKAUER SUCT BULB TIP NO VENT (SUCTIONS) ×2 IMPLANT

## 2021-07-19 NOTE — Discharge Instructions (Addendum)
Information on my medicine - ELIQUIS? (apixaban) ? ?Why was Eliquis? prescribed for you? ?Eliquis? was prescribed for you to reduce the risk of blood clots forming after orthopedic surgery.   ? ?What do You need to know about Eliquis?? ?Take your Eliquis? TWICE DAILY - one tablet in the morning and one tablet in the evening with or without food.  It would be best to take the dose about the same time each day. ? ?If you have difficulty swallowing the tablet whole please discuss with your pharmacist how to take the medication safely. ? ?Take Eliquis? exactly as prescribed by your doctor and DO NOT stop taking Eliquis? without talking to the doctor who prescribed the medication.  Stopping without other medication to take the place of Eliquis? may increase your risk of developing a clot. ? ?After discharge, you should have regular check-up appointments with your healthcare provider that is prescribing your Eliquis?. ? ?What do you do if you miss a dose? ?If a dose of ELIQUIS? is not taken at the scheduled time, take it as soon as possible on the same day and twice-daily administration should be resumed.  The dose should not be doubled to make up for a missed dose.  Do not take more than one tablet of ELIQUIS at the same time. ? ?Important Safety Information ?A possible side effect of Eliquis? is bleeding. You should call your healthcare provider right away if you experience any of the following: ?Bleeding from an injury or your nose that does not stop. ?Unusual colored urine (red or dark brown) or unusual colored stools (red or black). ?Unusual bruising for unknown reasons. ?A serious fall or if you hit your head (even if there is no bleeding). ? ?Some medicines may interact with Eliquis? and might increase your risk of bleeding or clotting while on Eliquis?Marland Kitchen To help avoid this, consult your healthcare provider or pharmacist prior to using any new prescription or non-prescription medications, including herbals,  vitamins, non-steroidal anti-inflammatory drugs (NSAIDs) and supplements. ? ?This website has more information on Eliquis? (apixaban): http://www.eliquis.com/eliquis/home  ? ?INSTRUCTIONS AFTER JOINT REPLACEMENT  ? ?Remove items at home which could result in a fall. This includes throw rugs or furniture in walking pathways ?ICE to the affected joint every three hours while awake for 30 minutes at a time, for at least the first 3-5 days, and then as needed for pain and swelling.  Continue to use ice for pain and swelling. You may notice swelling that will progress down to the foot and ankle.  This is normal after surgery.  Elevate your leg when you are not up walking on it.   ?Continue to use the breathing machine you got in the hospital (incentive spirometer) which will help keep your temperature down.  It is common for your temperature to cycle up and down following surgery, especially at night when you are not up moving around and exerting yourself.  The breathing machine keeps your lungs expanded and your temperature down. ? ? ?DIET:  As you were doing prior to hospitalization, we recommend a well-balanced diet. ? ?DRESSING / WOUND CARE / SHOWERING ? ?Keep the surgical dressing until follow up.  The dressing is water proof, so you can shower without any extra covering.  IF THE DRESSING FALLS OFF or the wound gets wet inside, change the dressing with sterile gauze.  Please use good hand washing techniques before changing the dressing.  Do not use any lotions or creams on the incision until instructed  by your surgeon.   ? ?ACTIVITY ? ?Increase activity slowly as tolerated, but follow the weight bearing instructions below.   ?No driving for 6 weeks or until further direction given by your physician.  You cannot drive while taking narcotics.  ?No lifting or carrying greater than 10 lbs. until further directed by your surgeon. ?Avoid periods of inactivity such as sitting longer than an hour when not asleep. This helps  prevent blood clots.  ?You may return to work once you are authorized by your doctor.  ? ? ? ?WEIGHT BEARING  ? ?Weight bearing as tolerated with assist device (walker, cane, etc) as directed, use it as long as suggested by your surgeon or therapist, typically at least 4-6 weeks. ? ? ?EXERCISES ? ?Results after joint replacement surgery are often greatly improved when you follow the exercise, range of motion and muscle strengthening exercises prescribed by your doctor. Safety measures are also important to protect the joint from further injury. Any time any of these exercises cause you to have increased pain or swelling, decrease what you are doing until you are comfortable again and then slowly increase them. If you have problems or questions, call your caregiver or physical therapist for advice.  ? ?Rehabilitation is important following a joint replacement. After just a few days of immobilization, the muscles of the leg can become weakened and shrink (atrophy).  These exercises are designed to build up the tone and strength of the thigh and leg muscles and to improve motion. Often times heat used for twenty to thirty minutes before working out will loosen up your tissues and help with improving the range of motion but do not use heat for the first two weeks following surgery (sometimes heat can increase post-operative swelling).  ? ?These exercises can be done on a training (exercise) mat, on the floor, on a table or on a bed. Use whatever works the best and is most comfortable for you.    Use music or television while you are exercising so that the exercises are a pleasant break in your day. This will make your life better with the exercises acting as a break in your routine that you can look forward to.   Perform all exercises about fifteen times, three times per day or as directed.  You should exercise both the operative leg and the other leg as well. ? ?Exercises include: ?  ?Quad Sets - Tighten up the muscle  on the front of the thigh (Quad) and hold for 5-10 seconds.   ?Straight Leg Raises - With your knee straight (if you were given a brace, keep it on), lift the leg to 60 degrees, hold for 3 seconds, and slowly lower the leg.  Perform this exercise against resistance later as your leg gets stronger.  ?Leg Slides: Lying on your back, slowly slide your foot toward your buttocks, bending your knee up off the floor (only go as far as is comfortable). Then slowly slide your foot back down until your leg is flat on the floor again.  ?Angel Wings: Lying on your back spread your legs to the side as far apart as you can without causing discomfort.  ?Hamstring Strength:  Lying on your back, push your heel against the floor with your leg straight by tightening up the muscles of your buttocks.  Repeat, but this time bend your knee to a comfortable angle, and push your heel against the floor.  You may put a pillow under the heel to  make it more comfortable if necessary.  ? ?A rehabilitation program following joint replacement surgery can speed recovery and prevent re-injury in the future due to weakened muscles. Contact your doctor or a physical therapist for more information on knee rehabilitation.  ? ? ?CONSTIPATION ? ?Constipation is defined medically as fewer than three stools per week and severe constipation as less than one stool per week.  Even if you have a regular bowel pattern at home, your normal regimen is likely to be disrupted due to multiple reasons following surgery.  Combination of anesthesia, postoperative narcotics, change in appetite and fluid intake all can affect your bowels.  ? ?YOU MUST use at least one of the following options; they are listed in order of increasing strength to get the job done.  They are all available over the counter, and you may need to use some, POSSIBLY even all of these options:   ? ?Drink plenty of fluids (prune juice may be helpful) and high fiber foods ?Colace 100 mg by mouth  twice a day  ?Senokot for constipation as directed and as needed Dulcolax (bisacodyl), take with full glass of water  ?Miralax (polyethylene glycol) once or twice a day as needed. ? ?If you have tried all these

## 2021-07-19 NOTE — Anesthesia Postprocedure Evaluation (Signed)
Anesthesia Post Note ? ?Patient: [name] ? ?Procedure(s) Performed: RIGHT TOTAL HIP ARTHROPLASTY ANTERIOR APPROACH (Right: Hip) ? ?  ? ?Patient location during evaluation: PACU ?Anesthesia Type: Spinal ?Level of consciousness: awake and alert ?Pain management: pain level controlled ?Vital Signs Assessment: post-procedure vital signs reviewed and stable ?Respiratory status: spontaneous breathing and respiratory function stable ?Cardiovascular status: blood pressure returned to baseline and stable ?Postop Assessment: spinal receding ?Anesthetic complications: no ? ? ?No notable events documented. ? ?Last Vitals:  ?Vitals:  ? 07/19/21 1430 07/19/21 1445  ?BP: 115/72 (!) 137/92  ?Pulse: 72 84  ?Resp: 16 15  ?Temp:    ?SpO2: 99% 99%  ?  ?Last Pain:  ?Vitals:  ? 07/19/21 1445  ?PainSc: 0-No pain  ? ? ?  ?  ?  ?  ?  ?  ? ?Suzette Battiest E ? ? ? ? ?

## 2021-07-19 NOTE — Anesthesia Preprocedure Evaluation (Signed)
Anesthesia Evaluation  ?Patient identified by MRN, date of birth, ID band ?Patient awake ? ? ? ?Reviewed: ?Allergy & Precautions, NPO status , Patient's Chart, lab work & pertinent test results ? ?Airway ?Mallampati: II ? ?TM Distance: >3 FB ?Neck ROM: Full ? ? ? Dental ? ?(+) Dental Advisory Given ?  ?Pulmonary ?former smoker,  ?  ?breath sounds clear to auscultation ? ? ? ? ? ? Cardiovascular ?negative cardio ROS ? ? ?Rhythm:Regular Rate:Normal ? ? ?  ?Neuro/Psych ?negative neurological ROS ?   ? GI/Hepatic ?Neg liver ROS, GERD  ,  ?Endo/Other  ?negative endocrine ROS ? Renal/GU ?negative Renal ROS  ? ?  ?Musculoskeletal ? ?(+) Arthritis ,  ? Abdominal ?  ?Peds ? Hematology ?negative hematology ROS ?(+)   ?Anesthesia Other Findings ? ? Reproductive/Obstetrics ? ?  ? ? ? ? ? ? ? ? ? ? ? ? ? ?  ?  ? ? ? ? ? ? ? ? ?Lab Results  ?Component Value Date  ? WBC 6.4 07/08/2021  ? HGB 13.8 07/08/2021  ? HCT 42.1 07/08/2021  ? MCV 95.0 07/08/2021  ? PLT 260 07/08/2021  ? ? ?Anesthesia Physical ?Anesthesia Plan ? ?ASA: 2 ? ?Anesthesia Plan: Spinal  ? ?Post-op Pain Management: Tylenol PO (pre-op)* and Toradol IV (intra-op)*  ? ?Induction:  ? ?PONV Risk Score and Plan: 2 and Propofol infusion, Ondansetron and Treatment may vary due to age or medical condition ? ?Airway Management Planned: Natural Airway and Simple Face Mask ? ?Additional Equipment: None ? ?Intra-op Plan:  ? ?Post-operative Plan:  ? ?Informed Consent: I have reviewed the patients History and Physical, chart, labs and discussed the procedure including the risks, benefits and alternatives for the proposed anesthesia with the patient or authorized representative who has indicated his/her understanding and acceptance.  ? ? ? ? ? ?Plan Discussed with: CRNA ? ?Anesthesia Plan Comments:   ? ? ? ? ? ? ?Anesthesia Quick Evaluation ? ?

## 2021-07-19 NOTE — Transfer of Care (Signed)
Immediate Anesthesia Transfer of Care Note ? ?Patient: [name] ? ?Procedure(s) Performed: RIGHT TOTAL HIP ARTHROPLASTY ANTERIOR APPROACH (Right: Hip) ? ?Patient Location: PACU ? ?Anesthesia Type:Spinal ? ?Level of Consciousness: awake, alert  and patient cooperative ? ?Airway & Oxygen Therapy: Patient Spontanous Breathing and Patient connected to face mask oxygen ? ?Post-op Assessment: Report given to RN and Post -op Vital signs reviewed and stable ? ?Post vital signs: Reviewed and stable ? ?Last Vitals:  ?Vitals Value Taken Time  ?BP 103/61 07/19/21 1345  ?Temp    ?Pulse 99 07/19/21 1346  ?Resp 6 07/19/21 1346  ?SpO2 96 % 07/19/21 1346  ?Vitals shown include unvalidated device data. ? ?Last Pain:  ?Vitals:  ? 07/19/21 0902  ?PainSc: 3   ?   ? ?Patients Stated Pain Goal: 3 (07/19/21 0902) ? ?Complications: No notable events documented. ?

## 2021-07-19 NOTE — Anesthesia Procedure Notes (Addendum)
Spinal ? ?Patient location during procedure: OR ?Start time: 07/19/2021 11:45 AM ?End time: 07/19/2021 12:01 PM ?Reason for block: surgical anesthesia ?Staffing ?Performed: anesthesiologist  ?Anesthesiologist: Suzette Battiest, MD ?Preanesthetic Checklist ?Completed: patient identified, IV checked, site marked, risks and benefits discussed, surgical consent, monitors and equipment checked, pre-op evaluation and timeout performed ?Spinal Block ?Patient position: sitting ?Prep: DuraPrep ?Patient monitoring: heart rate, cardiac monitor, continuous pulse ox and blood pressure ?Approach: right paramedian ?Location: L4-5 ?Injection technique: single-shot ?Needle ?Needle type: Quincke  ?Needle gauge: 22 G ?Needle length: 9 cm ?Assessment ?Sensory level: T4 ?Events: CSF return and second provider ?Additional Notes ?Multiple attempts via midline approach by CRNA then myself unsuccessful. Eventual successful paramedian SAB obtained . ? ? ? ?

## 2021-07-19 NOTE — Brief Op Note (Signed)
07/19/2021 ? ?1:15 PM ? ?PATIENT:  [name]  56 y.o. female ? ?PRE-OPERATIVE DIAGNOSIS:  RIGHT HIP OSTEOARTHRITIS ? ?POST-OPERATIVE DIAGNOSIS:  RIGHT HIP OSTEOARTHRITIS ? ?PROCEDURE:  Procedure(s): ?RIGHT TOTAL HIP ARTHROPLASTY ANTERIOR APPROACH (Right) ? ?SURGEON:  Surgeon(s) and Role: ?   Mcarthur Rossetti, MD - Primary ? ?PHYSICIAN ASSISTANT:  Benita Stabile, PA-C assisted ? ?ANESTHESIA:   spinal ? ?EBL:  200 mL  ? ?COUNTS:  YES ? ?DICTATION: .Other Dictation: Dictation Number [id number] ? ?PLAN OF CARE: Admit for overnight observation ? ?PATIENT DISPOSITION:  PACU - hemodynamically stable. ?  ?Delay start of Pharmacological VTE agent (>24hrs) due to surgical blood loss or risk of bleeding: no ? ?

## 2021-07-19 NOTE — Evaluation (Signed)
Physical Therapy Evaluation ?Patient Details ?Name: [name] ?MRN: [id number] ?DOB: [birth year] ?Today's Date: 07/19/2021 ? ?History of Present Illness ? Pt s/p R THR  ?Clinical Impression ? Pt s/p R THR and presents with decreased R LE strength/ROM and post op pain limiting functional mobility.  Pt should progress well to dc home with family assist. ?   ? ?Recommendations for follow up therapy are one component of a multi-disciplinary discharge planning process, led by the attending physician.  Recommendations may be updated based on patient status, additional functional criteria and insurance authorization. ? ?Follow Up Recommendations Follow physician's recommendations for discharge plan and follow up therapies ? ?  ?Assistance Recommended at Discharge Intermittent Supervision/Assistance  ?Patient can return home with the following ? A little help with walking and/or transfers;A little help with bathing/dressing/bathroom;Assistance with cooking/housework;Assist for transportation;Help with stairs or ramp for entrance ? ?  ?Equipment Recommendations None recommended by PT  ?Recommendations for Other Services ?    ?  ?Functional Status Assessment Patient has had a recent decline in their functional status and demonstrates the ability to make significant improvements in function in a reasonable and predictable amount of time.  ? ?  ?Precautions / Restrictions Precautions ?Precautions: Fall ?Restrictions ?Weight Bearing Restrictions: No ?Other Position/Activity Restrictions: WBAT  ? ?  ? ?Mobility ? Bed Mobility ?Overal bed mobility: Needs Assistance ?Bed Mobility: Supine to Sit, Sit to Supine ?  ?  ?Supine to sit: Min guard ?Sit to supine: Min assist ?  ?General bed mobility comments: cues for sequence and use of L LE to self assist ?  ? ?Transfers ?Overall transfer level: Needs assistance ?Equipment used: Rolling walker (2 wheels) ?Transfers: Sit to/from Stand ?Sit to Stand: Min assist ?  ?  ?  ?  ?  ?General  transfer comment: cues for LE management and use of UEs to self assist ?  ? ?Ambulation/Gait ?Ambulation/Gait assistance: Min assist, Min guard ?Gait Distance (Feet): 78 Feet ?Assistive device: Rolling walker (2 wheels) ?Gait Pattern/deviations: Step-to pattern, Decreased step length - right, Decreased step length - left, Shuffle, Trunk flexed ?  ?  ?  ?General Gait Details: cues for posture, position from RW and initial sequence ? ?Stairs ?  ?  ?  ?  ?  ? ?Wheelchair Mobility ?  ? ?Modified Rankin (Stroke Patients Only) ?  ? ?  ? ?Balance Overall balance assessment: Needs assistance ?Sitting-balance support: No upper extremity supported, Feet supported ?Sitting balance-Leahy Scale: Fair ?  ?  ?Standing balance support: Single extremity supported ?Standing balance-Leahy Scale: Poor ?  ?  ?  ?  ?  ?  ?  ?  ?  ?  ?  ?  ?   ? ? ? ?Pertinent Vitals/Pain Pain Assessment ?Pain Assessment: 0-10 ?Pain Score: 5  ?Pain Location: R hip ?Pain Descriptors / Indicators: Aching, Sore ?Pain Intervention(s): Limited activity within patient's tolerance, Monitored during session, Premedicated before session, Ice applied  ? ? ?Home Living Family/patient expects to be discharged to:: Private residence ?Living Arrangements: Alone ?Available Help at Discharge: Family;Friend(s);Available PRN/intermittently ?Type of Home: House ?Home Access: Stairs to enter ?Entrance Stairs-Rails: Right ?Entrance Stairs-Number of Steps: 3+1 ?  ?Home Layout: One level ?Home Equipment: Conservation officer, nature (2 wheels);Toilet riser;Cane - single point ?   ?  ?Prior Function Prior Level of Function : Independent/Modified Independent ?  ?  ?  ?  ?  ?  ?  ?  ?  ? ? ?Hand Dominance  ?   ? ?  ?  Extremity/Trunk Assessment  ? Upper Extremity Assessment ?Upper Extremity Assessment: Overall WFL for tasks assessed ?  ? ?Lower Extremity Assessment ?Lower Extremity Assessment: RLE deficits/detail ?  ? ?Cervical / Trunk Assessment ?Cervical / Trunk Assessment: Normal   ?Communication  ? Communication: No difficulties  ?Cognition Arousal/Alertness: Awake/alert ?Behavior During Therapy: [id number] for tasks assessed/performed ?Overall Cognitive Status: Within Functional Limits for tasks assessed ?  ?  ?  ?  ?  ?  ?  ?  ?  ?  ?  ?  ?  ?  ?  ?  ?  ?  ?  ? ?  ?General Comments   ? ?  ?Exercises Total Joint Exercises ?Ankle Circles/Pumps: AROM, Both, 15 reps, Supine  ? ?Assessment/Plan  ?  ?PT Assessment Patient needs continued PT services  ?PT Problem List Decreased strength;Decreased range of motion;Decreased activity tolerance;Decreased balance;Decreased mobility;Decreased knowledge of use of DME;Pain ? ?   ?  ?PT Treatment Interventions DME instruction;Gait training;Stair training;Functional mobility training;Therapeutic activities;Therapeutic exercise;Patient/family education   ? ?PT Goals (Current goals can be found in the Care Plan section)  ?Acute Rehab PT Goals ?Patient Stated Goal: Regain IND ?PT Goal Formulation: With patient ?Time For Goal Achievement: 07/26/21 ?Potential to Achieve Goals: Good ? ?  ?Frequency 7X/week ?  ? ? ?Co-evaluation   ?  ?  ?  ?  ? ? ?  ?AM-PAC PT "6 Clicks" Mobility  ?Outcome Measure Help needed turning from your back to your side while in a flat bed without using bedrails?: A Little ?Help needed moving from lying on your back to sitting on the side of a flat bed without using bedrails?: A Little ?Help needed moving to and from a bed to a chair (including a wheelchair)?: A Little ?Help needed standing up from a chair using your arms (e.g., wheelchair or bedside chair)?: A Little ?Help needed to walk in hospital room?: A Little ?Help needed climbing 3-5 steps with a railing? : A Little ?6 Click Score: 18 ? ?  ?End of Session Equipment Utilized During Treatment: Gait belt ?Activity Tolerance: Patient tolerated treatment well ?Patient left: with call bell/phone within reach;in bed;with bed alarm set;with family/visitor present ?Nurse Communication: Mobility  status;Patient requests pain meds ?PT Visit Diagnosis: Difficulty in walking, not elsewhere classified (R26.2) ?  ? ?Time: 2202-5427 ?PT Time Calculation (min) (ACUTE ONLY): 30 min ? ? ?Charges:   PT Evaluation ?$PT Eval Low Complexity: 1 Low ?PT Treatments ?$Gait Training: 8-22 mins ?  ?   ? ? ?Debe Coder PT ?Acute Rehabilitation Services ?Pager [phone] ?Office [phone] ? ? ?Hady Niemczyk ?07/19/2021, 6:46 PM ? ?

## 2021-07-19 NOTE — Interval H&P Note (Signed)
History and Physical Interval Note: The patient understands that she is here today for a right hip replacement to treat her right hip osteoarthritis.  There has been no acute or interval change in her medical status.  Please see H&P.  The risks and benefits of surgery been explained in detail and informed consent is obtained.  The right operative hip has been marked. ? ?07/19/2021 ?10:03 AM ? ?[name]  has presented today for surgery, with the diagnosis of RIGHT HIP OSTEOARTHRITIS.  The various methods of treatment have been discussed with the patient and family. After consideration of risks, benefits and other options for treatment, the patient has consented to  Procedure(s): ?RIGHT TOTAL HIP ARTHROPLASTY ANTERIOR APPROACH (Right) as a surgical intervention.  The patient's history has been reviewed, patient examined, no change in status, stable for surgery.  I have reviewed the patient's chart and labs.  Questions were answered to the patient's satisfaction.   ? ? ?Mcarthur Rossetti ? ? ?

## 2021-07-20 ENCOUNTER — Other Ambulatory Visit: Payer: Self-pay | Admitting: Orthopaedic Surgery

## 2021-07-20 DIAGNOSIS — M1611 Unilateral primary osteoarthritis, right hip: Secondary | ICD-10-CM | POA: Diagnosis not present

## 2021-07-20 LAB — BASIC METABOLIC PANEL
Anion gap: 7 (ref 5–15)
BUN: 11 mg/dL (ref 6–20)
CO2: 26 mmol/L (ref 22–32)
Calcium: 9.1 mg/dL (ref 8.9–10.3)
Chloride: 105 mmol/L (ref 98–111)
Creatinine, Ser: 0.65 mg/dL (ref 0.44–1.00)
GFR, Estimated: >60 mL/min (ref >60)
Glucose, Bld: 111 mg/dL — ABNORMAL HIGH (ref 70–99)
Potassium: 3.9 mmol/L (ref 3.5–5.1)
Sodium: 138 mmol/L (ref 135–145)

## 2021-07-20 LAB — CBC
HCT: 34.4 % — ABNORMAL LOW (ref 36.0–46.0)
Hemoglobin: 11.5 g/dL — ABNORMAL LOW (ref 12.0–15.0)
MCH: 31.6 pg (ref 26.0–34.0)
MCHC: 33.4 g/dL (ref 30.0–36.0)
MCV: 94.5 fL (ref 80.0–100.0)
Platelets: 221 10*3/uL (ref 150–400)
RBC: 3.64 MIL/uL — ABNORMAL LOW (ref 3.87–5.11)
RDW: 12.5 % (ref 11.5–15.5)
WBC: 9.2 10*3/uL (ref 4.0–10.5)
nRBC: 0 % (ref 0.0–0.2)

## 2021-07-20 MED ORDER — OXYCODONE HCL 5 MG PO TABS: 5.0000 mg | ORAL_TABLET | ORAL | 0 refills | Status: DC | PRN

## 2021-07-20 MED ORDER — APIXABAN 2.5 MG PO TABS: 2.5000 mg | ORAL_TABLET | Freq: Two times a day (BID) | ORAL | 0 refills | Status: DC

## 2021-07-20 MED ORDER — METHOCARBAMOL 500 MG PO TABS
500.0000 mg | ORAL_TABLET | Freq: Four times a day (QID) | ORAL | 0 refills | Status: DC | PRN
Start: 2021-07-20 — End: 2021-07-31

## 2021-07-20 MED ORDER — METHOCARBAMOL 500 MG PO TABS: 500.0000 mg | ORAL_TABLET | Freq: Four times a day (QID) | ORAL | 0 refills | Status: DC | PRN

## 2021-07-20 NOTE — Progress Notes (Signed)
Subjective: ?1 Day Post-Op Procedure(s) (LRB): ?RIGHT TOTAL HIP ARTHROPLASTY ANTERIOR APPROACH (Right) ?Patient reports pain as moderate.   ? ?Objective: ?Vital signs in last 24 hours: ?Temp:  [97.7 ?F (36.5 ?C)-99.4 ?F (37.4 ?C)] 99 ?F (37.2 ?C) (04/22 0504) ?Pulse Rate:  [72-99] 97 (04/22 0504) ?Resp:  [13-24] 18 (04/22 0504) ?BP: (103-141)/(61-93) 140/81 (04/22 0504) ?SpO2:  [95 %-100 %] 97 % (04/22 0504) ? ?Intake/Output from previous day: ?04/21 0701 - 04/22 0700 ?In: 1911.6 [I.V.:1661.6; IV Piggyback:250] ?Out: 3382 [Urine:3225; Blood:200] ?Intake/Output this shift: ?Total I/O ?In: 350 [P.O.:350] ?Out: -  ? ?Recent Labs  ?  07/20/21 ?0319  ?HGB 11.5*  ? ?Recent Labs  ?  07/20/21 ?0319  ?WBC 9.2  ?RBC 3.64*  ?HCT 34.4*  ?PLT 221  ? ?Recent Labs  ?  07/20/21 ?0319  ?NA 138  ?K 3.9  ?CL 105  ?CO2 26  ?BUN 11  ?CREATININE 0.65  ?GLUCOSE 111*  ?CALCIUM 9.1  ? ?No results for input(s): LABPT, INR in the last 72 hours. ? ?Sensation intact distally ?Intact pulses distally ?Dorsiflexion/Plantar flexion intact ?Incision: dressing C/D/I ? ? ?Assessment/Plan: ?1 Day Post-Op Procedure(s) (LRB): ?RIGHT TOTAL HIP ARTHROPLASTY ANTERIOR APPROACH (Right) ?Up with therapy ?Discharge home with home health ? ? ? ? ? ?[name] ?07/20/2021, 10:24 AM ? ?

## 2021-07-20 NOTE — Op Note (Signed)
[name], [name] ?MEDICAL RECORD NO: [id number] ?ACCOUNT NO: [id number] ?DATE OF BIRTH: 1965/09/26 ?FACILITY: [facility] ?LOCATION: [facility] ?PHYSICIAN: Lind Guest. Ninfa Linden, MD ? ?Operative Report  ? ?DATE OF PROCEDURE: 07/19/2021 ? ?PREOPERATIVE DIAGNOSIS:  Primary osteoarthritis and degenerative joint disease, right hip. ? ?POSTOPERATIVE DIAGNOSIS:  Primary osteoarthritis and degenerative joint disease, right hip. ? ?PROCEDURE:  Right total hip arthroplasty through direct anterior approach. ? ?IMPLANTS:  DePuy sector Gription acetabular component size 50 with a single screw, size 32+0 neutral polyethylene liner, size 2 ACTIS femoral component with high offset, size 32+1 ceramic hip ball. ? ?SURGEON:  Lind Guest. Ninfa Linden, MD ? ?ASSISTANT:  Erskine Emery, PA-C ? ?ANESTHESIA:  Spinal. ? ?ANTIBIOTICS:  2 g IV Ancef. ? ?ESTIMATED BLOOD LOSS:  200 mL ? ?COMPLICATIONS:  None. ? ?INDICATIONS:  The patient is a very pleasant 56 year old female who unfortunately has debilitating arthritis of both her hips.  She has tried and failed all forms of conservative treatment and at this point presents for right total hip arthroplasty.   ?This has been her more painful hip, but again both hips have severe end-stage arthritis.  She has failed conservative treatment.  Her pain is daily and is detrimentally affecting her mobility, her quality of life and her activities of daily living.  We  ?did talk in length and detail about the risk of acute blood loss anemia, nerve or vessel injury, fracture, infection, dislocation, DVT, implant failure, leg length differences, and skin and soft tissue issues.  We talked about our goals being decreased  ?pain, improve mobility and overall improve quality of life. ? ?DESCRIPTION OF PROCEDURE:  After informed consent was obtained, appropriate right hip was marked.  She was brought to the operating room and sat up on the stretcher where spinal anesthesia was obtained.  She was laid in supine  position on the stretcher.  ? A Foley catheter was placed and traction boots were placed on both her feet.  Next, she was placed supine on the Hana fracture table, the perineal post in place and both legs in line skeletal traction device and no traction applied.  Her right operative ? hip was prepped and draped in DuraPrep and sterile drapes.  A timeout was called and she was identified as correct patient, correct right hip.  I then made an incision just inferior and posterior to the anterior iliac spine and carried this obliquely  ?down the leg.  We dissected down tensor fascia lata muscle and tensor fascia was then divided longitudinally to proceed with direct anterior approach to the hip.  I identified and cauterized circumflex vessels, then identified the hip capsule, opened up  ?the hip capsule in L-type format finding moderate joint effusion and significant periarticular osteophytes around the lateral femoral head and neck.  We then made our femoral neck cut with an oscillating saw, proximal to the lesser trochanter and  ?completed this with an osteotome.  I placed a corkscrew guide in the femoral head and removed the femoral head in its entirety and found it to be deformed and completely devoid of cartilage.  I then placed a bent Hohmann over the medial acetabular rim  ?and removed remnants of acetabular labrum and other debris.  I then began reaming under direct visualization from a size 43 reamer in a stepwise increments going up to a size 49 reamer with all reamers placed under direct visualization.  Last reamer was  ?placed under direct fluoroscopy, so I could see my depth of reaming, inclination  and anteversion.  I then placed real DePuy sector Gription acetabular component size 50 and a single screw and I went with a 32+0 neutral polyethylene liner.  Attention was  ?then turned to the femur.  With the leg externally rotated to 120 degrees, extended and adducted, we were able to place a Mueller retractor  medially and Hohman retractor behind the greater trochanter.  I released lateral joint capsule and used a box  ?cutting osteotome to enter femoral canal and a rongeur to lateralize, then began broaching using the ACTIS broaching system from a size 0 going to a size 2.  With size 2 in place, we trialed a standard offset femoral neck and a 32+1 hip ball, reduced  ?this in the acetabulum and it was stable, but I felt like we just needed a little more offset, but I was pleased with leg length, so we dislocated the hip and removed the trial components.  I went with the real ACTIS femoral component size 2 with  ?high-offset component and the real 32+1 ceramic hip ball and again reduced this in the acetabulum and it was stable.  We then assessed radiographically and clinically.  I was pleased with leg length, offset, range of motion and stability.  We then  ?irrigated the soft tissue with normal saline solution.  The joint capsule closed with interrupted #1 Ethibond suture followed by #1 Vicryl to close the tensor fascia.  0 Vicryl was used to close the deep tissue and 2-0 Vicryl was used to close  ?subcutaneous tissue.  The skin was closed with staples.  An Aquacel dressing was applied.  She was taken off the Hana table and taken to recovery room in stable condition with all final counts being correct.  No complications noted.  Of note, Rosanna Randy  ?Clark, PA-C did assist in entire case and assistance was crucial for facilitating all aspects of this case. ? ? ?[facility] ?D: 07/19/2021 1:14:06 pm T: 07/20/2021 12:04:00 am  ?JOB: [id number]/ [id number]  ?

## 2021-07-20 NOTE — Progress Notes (Signed)
Physical Therapy Treatment ?Patient Details ?Name: [name] ?MRN: [id number] ?DOB: 1966-01-21 ?Today's Date: 07/20/2021 ? ? ?History of Present Illness Pt s/p R THR ? ?  ?PT Comments  ? ? Pt motivated and progressing well with mobility.  Pt performed therex program, ambulated increased distance in hall, negotiated stairs, and reviewed car transfers.  Pt eager for dc home this date.  ?Recommendations for follow up therapy are one component of a multi-disciplinary discharge planning process, led by the attending physician.  Recommendations may be updated based on patient status, additional functional criteria and insurance authorization. ? ?Follow Up Recommendations ? Follow physician's recommendations for discharge plan and follow up therapies ?  ?  ?Assistance Recommended at Discharge Intermittent Supervision/Assistance  ?Patient can return home with the following A little help with walking and/or transfers;A little help with bathing/dressing/bathroom;Assistance with cooking/housework;Assist for transportation;Help with stairs or ramp for entrance ?  ?Equipment Recommendations ? None recommended by PT  ?  ?Recommendations for Other Services   ? ? ?  ?Precautions / Restrictions Precautions ?Precautions: Fall ?Restrictions ?Weight Bearing Restrictions: No ?RLE Weight Bearing: Weight bearing as tolerated  ?  ? ?Mobility ? Bed Mobility ?Overal bed mobility: Needs Assistance ?Bed Mobility: Supine to Sit ?  ?  ?Supine to sit: Min guard ?  ?  ?General bed mobility comments: cues for sequence and use of L LE to self assist ?  ? ?Transfers ?Overall transfer level: Needs assistance ?Equipment used: Rolling walker (2 wheels) ?Transfers: Sit to/from Stand ?Sit to Stand: Min guard, Supervision ?  ?  ?  ?  ?  ?General transfer comment: cues for LE management and use of UEs to self assist ?  ? ?Ambulation/Gait ?Ambulation/Gait assistance: Min guard, Supervision ?Gait Distance (Feet): 250 Feet (2x125 with seated rest  break) ?Assistive device: Rolling walker (2 wheels) ?Gait Pattern/deviations: Step-to pattern, Decreased step length - right, Decreased step length - left, Shuffle, Trunk flexed ?  ?  ?  ?General Gait Details: cues for posture, position from RW and initial sequence ? ? ?Stairs ?Stairs: Yes ?Stairs assistance: Min guard ?Stair Management: One rail Left, Step to pattern, Forwards, With cane ?Number of Stairs: 6 ?General stair comments: 3 steps twice with cues for sequence and foot/cane placement ? ? ?Wheelchair Mobility ?  ? ?Modified Rankin (Stroke Patients Only) ?  ? ? ?  ?Balance Overall balance assessment: Needs assistance ?Sitting-balance support: No upper extremity supported, Feet supported ?Sitting balance-Leahy Scale: Good ?  ?  ?Standing balance support: No upper extremity supported ?Standing balance-Leahy Scale: Fair ?  ?  ?  ?  ?  ?  ?  ?  ?  ?  ?  ?  ?  ? ?  ?Cognition Arousal/Alertness: Awake/alert ?Behavior During Therapy: [facility] for tasks assessed/performed ?Overall Cognitive Status: Within Functional Limits for tasks assessed ?  ?  ?  ?  ?  ?  ?  ?  ?  ?  ?  ?  ?  ?  ?  ?  ?  ?  ?  ? ?  ?Exercises Total Joint Exercises ?Ankle Circles/Pumps: AROM, Both, 15 reps, Supine ?Quad Sets: AROM, Both, 10 reps, Supine ?Heel Slides: AAROM, Right, 20 reps, Supine ?Hip ABduction/ADduction: AAROM, Right, 15 reps, Supine ? ?  ?General Comments   ?  ?  ? ?Pertinent Vitals/Pain Pain Assessment ?Pain Assessment: 0-10 ?Pain Score: 4  ?Pain Location: R hip ?Pain Descriptors / Indicators: Aching, Sore ?Pain Intervention(s): Limited activity within patient's tolerance, Monitored  during session, Premedicated before session, Ice applied  ? ? ?Home Living Family/patient expects to be discharged to:: Private residence ?Living Arrangements: Alone ?Available Help at Discharge: Family;Friend(s);Available PRN/intermittently ?Type of Home: House ?Home Access: Stairs to enter ?Entrance Stairs-Rails: Right ?Entrance Stairs-Number of  Steps: 3+1 ?  ?  ?  ?   ?  ?Prior Function    ?  ?  ?   ? ?PT Goals (current goals can now be found in the care plan section) Acute Rehab PT Goals ?Patient Stated Goal: Regain IND ?PT Goal Formulation: With patient ?Time For Goal Achievement: 07/26/21 ?Potential to Achieve Goals: Good ?Progress towards PT goals: Progressing toward goals ? ?  ?Frequency ? ? ? 7X/week ? ? ? ?  ?PT Plan Current plan remains appropriate  ? ? ?Co-evaluation   ?  ?  ?  ?  ? ?  ?AM-PAC PT "6 Clicks" Mobility   ?Outcome Measure ? Help needed turning from your back to your side while in a flat bed without using bedrails?: A Little ?Help needed moving from lying on your back to sitting on the side of a flat bed without using bedrails?: A Little ?Help needed moving to and from a bed to a chair (including a wheelchair)?: A Little ?Help needed standing up from a chair using your arms (e.g., wheelchair or bedside chair)?: A Little ?Help needed to walk in hospital room?: A Little ?Help needed climbing 3-5 steps with a railing? : A Little ?6 Click Score: 18 ? ?  ?End of Session Equipment Utilized During Treatment: Gait belt ?Activity Tolerance: Patient tolerated treatment well ?Patient left: with call bell/phone within reach;Other (comment) (bathroom) ?Nurse Communication: Mobility status ?PT Visit Diagnosis: Difficulty in walking, not elsewhere classified (R26.2) ?  ? ? ?Time: 2800-3491 ?PT Time Calculation (min) (ACUTE ONLY): 46 min ? ?Charges:  $Gait Training: 8-22 mins ?$Therapeutic Exercise: 8-22 mins ?$Therapeutic Activity: 8-22 mins          ?          ? ?Debe Coder PT ?Acute Rehabilitation Services ?Pager [phone] ?Office [phone] ? ? ? ?[name] ?07/20/2021, 12:20 PM ? ?

## 2021-07-20 NOTE — Progress Notes (Signed)
Patient has walker at home  ? ?

## 2021-07-20 NOTE — Discharge Summary (Signed)
?Patient ID: ?[name] ?MRN: [id number] ?DOB/AGE: 1965/11/30 56 y.o. ? ?Admit date: 07/19/2021 ?Discharge date: 07/20/2021 ? ?Admission Diagnoses:  ?Principal Problem: ?  Unilateral primary osteoarthritis, right hip ?Active Problems: ?  Status post total replacement of right hip ? ? ?Discharge Diagnoses:  ?Same ? ?Past Medical History:  ?Diagnosis Date  ? Arthritis   ? Colon polyps   ? Endometriosis   ? Fibroid, uterine   ? GERD (gastroesophageal reflux disease)   ? H. pylori infection   ? HLD (hyperlipidemia)   ? ? ?Surgeries: Procedure(s): ?RIGHT TOTAL HIP ARTHROPLASTY ANTERIOR APPROACH on 07/19/2021 ?  ?Consultants:  ? ?Discharged Condition: Improved ? ?Hospital Course: [name] is an 56 y.o. female who was admitted 07/19/2021 for operative treatment ofUnilateral primary osteoarthritis, right hip. Patient has severe unremitting pain that affects sleep, daily activities, and work/hobbies. After pre-op clearance the patient was taken to the operating room on 07/19/2021 and underwent  Procedure(s): ?RIGHT TOTAL HIP ARTHROPLASTY ANTERIOR APPROACH.   ? ?Patient was given perioperative antibiotics:  ?Anti-infectives (From admission, onward)  ? ? Start     Dose/Rate Route Frequency Ordered Stop  ? 07/19/21 1800  ceFAZolin (ANCEF) IVPB 1 g/50 mL premix       ? 1 g ?100 mL/hr over 30 Minutes Intravenous Every 6 hours 07/19/21 1522 07/20/21 0014  ? 07/19/21 0900  ceFAZolin (ANCEF) IVPB 2g/100 mL premix       ? 2 g ?200 mL/hr over 30 Minutes Intravenous On call to O.R. 07/19/21 6283 07/19/21 1212  ? ?  ?  ? ?Patient was given sequential compression devices, early ambulation, and chemoprophylaxis to prevent DVT. ? ?Patient benefited maximally from hospital stay and there were no complications.   ? ?Recent vital signs: Patient Vitals for the past 24 hrs: ? BP Temp Temp src Pulse Resp SpO2  ?07/20/21 0504 140/81 99 ?F (37.2 ?C) Oral 97 18 97 %  ?07/20/21 0120 134/78 99.4 ?F (37.4 ?C) Oral 90 16 97 %  ?07/19/21 2033  (!) 141/93 98.9 ?F (37.2 ?C) Oral 89 16 98 %  ?07/19/21 1851 138/80 98.9 ?F (37.2 ?C) Oral 99 13 95 %  ?07/19/21 1524 129/82 98 ?F (36.7 ?C) Oral 74 16 98 %  ?07/19/21 1500 138/81 -- -- 73 18 100 %  ?07/19/21 1445 (!) 137/92 -- -- 84 15 99 %  ?07/19/21 1430 115/72 -- -- 72 16 99 %  ?07/19/21 1415 128/86 -- -- 75 19 98 %  ?07/19/21 1400 121/76 -- -- 84 19 97 %  ?07/19/21 1345 103/61 97.7 ?F (36.5 ?C) -- 98 (!) 24 98 %  ?  ? ?Recent laboratory studies:  ?Recent Labs  ?  07/20/21 ?0319  ?WBC 9.2  ?HGB 11.5*  ?HCT 34.4*  ?PLT 221  ?NA 138  ?K 3.9  ?CL 105  ?CO2 26  ?BUN 11  ?CREATININE 0.65  ?GLUCOSE 111*  ?CALCIUM 9.1  ? ? ? ?Discharge Medications:   ?Allergies as of 07/20/2021   ?No Known Allergies ?  ? ?  ?Medication List  ?  ? ?STOP taking these medications   ? ?meloxicam 15 MG tablet ?Commonly known as: MOBIC ?  ? ?  ? ?TAKE these medications   ? ?acetaminophen 500 MG tablet ?Commonly known as: TYLENOL ?Take 1,000 mg by mouth in the morning, at noon, and at bedtime. ?  ?apixaban 2.5 MG Tabs tablet ?Commonly known as: ELIQUIS ?Take 1 tablet (2.5 mg total) by mouth 2 (two) times daily. ?  ?  CRANBERRY-VITAMIN C PO ?Take 1 capsule by mouth daily. ?  ?lovastatin 20 MG tablet ?Commonly known as: MEVACOR ?Take 20 mg by mouth every evening. ?  ?methocarbamol 500 MG tablet ?Commonly known as: ROBAXIN ?Take 1 tablet (500 mg total) by mouth every 6 (six) hours as needed for muscle spasms. ?  ?multivitamin tablet ?Take 1 tablet by mouth daily. ?  ?omeprazole 20 MG tablet ?Commonly known as: PRILOSEC OTC ?Take 2 tablets (40 mg total) by mouth daily. ?  ?OVER THE COUNTER MEDICATION ?Take 1 capsule by mouth daily. Circulation and Vein Support ?  ?OVER THE COUNTER MEDICATION ?Take 1 capsule by mouth daily. Complete Joint Effort ?  ?oxyCODONE 5 MG immediate release tablet ?Commonly known as: Oxy IR/ROXICODONE ?Take 1-2 tablets (5-10 mg total) by mouth every 4 (four) hours as needed for moderate pain (pain score 4-6). ?  ?Johnson ?Take 1 tablet by mouth daily. ?  ?PSYLLIUM PO ?Take 1 capsule by mouth daily. ?  ?zinc gluconate 50 MG tablet ?Take 50 mg by mouth daily. ?  ? ?  ? ?  ?  ? ? ?  ?Durable Medical Equipment  ?(From admission, onward)  ?  ? ? ?  ? ?  Start     Ordered  ? 07/19/21 1523  DME 3 n 1  Once       ? 07/19/21 1522  ? 07/19/21 1523  DME Walker rolling  Once       ?Question Answer Comment  ?Walker: With 5 Inch Wheels   ?Patient needs a walker to treat with the following condition Status post total replacement of right hip   ?  ? 07/19/21 1522  ? ?  ?  ? ?  ? ? ?Diagnostic Studies: DG Pelvis Portable ? ?Result Date: 07/19/2021 ?CLINICAL DATA:  Status post total right hip arthroplasty EXAM: PORTABLE PELVIS 1-2 VIEWS COMPARISON:  Radiograph dated April 24, 2021 FINDINGS: Status post right knee arthroplasty with intact hardware. Subcutaneous emphysema and surgical clips as expected. Moderate left hip osteoarthritis. IMPRESSION: Status post right knee arthroplasty without acute complications. Electronically Signed   By: Ashley Police D.O.   On: 07/19/2021 14:56  ? ?DG C-Arm 1-60 Min-No Report ? ?Result Date: 07/19/2021 ?Fluoroscopy was utilized by the requesting physician.  No radiographic interpretation.  ? ?DG C-Arm 1-60 Min-No Report ? ?Result Date: 07/19/2021 ?Fluoroscopy was utilized by the requesting physician.  No radiographic interpretation.  ? ?DG HIP UNILAT WITH PELVIS 1V RIGHT ? ?Result Date: 07/19/2021 ?CLINICAL DATA:  Fluoroscopic assistance for right hip arthroplasty EXAM: DG HIP (WITH OR WITHOUT PELVIS) 1V RIGHT COMPARISON:  04/24/2021 FINDINGS: Fluoroscopic images show right hip arthroplasty. No fracture is seen. Fluoroscopic time was 13 seconds. Estimated radiation dose was 2.16 mGy. IMPRESSION: Fluoroscopic assistance was provided for right hip arthroplasty. Electronically Signed   By: Ashley Picker M.D.   On: 07/19/2021 13:22   ? ?Disposition: Discharge disposition: 01-Home or Self  Care ? ? ? ? ? ? ? ? ? Follow-up Information   ? ? Ashley Rossetti, MD Follow up in 2 week(s).   ?Specialty: Orthopedic Surgery ?Contact information: ?[address] ?[place] [address] ?[phone] ? ? ?  ?  ? ?  ?  ? ?  ? ? ? ?Signed: ?[name] ?07/20/2021, 10:27 AM ? ? ? ?

## 2021-07-20 NOTE — TOC Transition Note (Signed)
Transition of Care (TOC) - CM/SW Discharge Note ? ? ?Patient Details  ?Name: [name] ?MRN: [id number] ?Date of Birth: 10-21-1965 ? ?Transition of Care (TOC) CM/SW Contact:  ?Ross Ludwig, LCSW ?Phone Number: ?07/20/2021, 11:54 AM ? ? ?Clinical Narrative:    ? ?CSW was informed that patient will need HH PT.  Patient did not have a preference for an agency.  CSW contacted [facility], they can not accept patient, CSW spoke to [facility], and they can accept patient.  CSW updated patient and bedside nurse.  Patient will be going home with home health through [facility].  CSW signing off please reconsult with any other social work needs, home health agency has been notified of planned discharge. ? ?Final next level of care: [place] ?Barriers to Discharge: Barriers Resolved ? ? ?Patient Goals and CMS Choice ?Patient states their goals for this hospitalization and ongoing recovery are:: To return back home with home health. ?CMS Medicare.gov Compare Post Acute Care list provided to:: Patient ?Choice offered to / list presented to : Patient ? ?Discharge Placement ?Patient discharging back home with home health PT.  ?           ?  ?  ?  ?  ? ?Discharge Plan and Services ?  ?  ?           ?  ?  ?  ?  ?  ?HH Arranged: PT ?[place] Agency: [facility] ?Date HH Agency Contacted: 07/20/21 ?Time [place]: 7253 ?Representative spoke with at [facility]: [place] ? ?Social Determinants of Health (SDOH) Interventions ?  ? ? ?Readmission Risk Interventions ?   ? View : No data to display.  ?  ?  ?  ? ? ? ? ? ?

## 2021-07-20 NOTE — Plan of Care (Signed)
?  Problem: Education: ?Goal: Knowledge of the prescribed therapeutic regimen will improve ?Outcome: Progressing ?Goal: Understanding of discharge needs will improve ?Outcome: Progressing ?Goal: Individualized Educational Video(s) ?Outcome: Progressing ?  ?Problem: Activity: ?Goal: Ability to avoid complications of mobility impairment will improve ?Outcome: Progressing ?  ?Problem: Skin Integrity: ?Goal: Will show signs of wound healing ?Outcome: Progressing ?  ?

## 2021-07-20 NOTE — Plan of Care (Signed)
?  Problem: Activity: Goal: Ability to tolerate increased activity will improve Outcome: Progressing   Problem: Pain Management: Goal: Pain level will decrease with appropriate interventions Outcome: Progressing   Problem: Education: Goal: Knowledge of General Education information will improve Description: Including pain rating scale, medication(s)/side effects and non-pharmacologic comfort measures Outcome: Progressing   

## 2021-07-21 ENCOUNTER — Encounter (HOSPITAL_COMMUNITY): Payer: Self-pay | Admitting: Orthopaedic Surgery

## 2021-07-31 ENCOUNTER — Ambulatory Visit (INDEPENDENT_AMBULATORY_CARE_PROVIDER_SITE_OTHER): Payer: Commercial Managed Care - PPO | Admitting: Orthopaedic Surgery

## 2021-07-31 ENCOUNTER — Encounter: Payer: Self-pay | Admitting: Orthopaedic Surgery

## 2021-07-31 DIAGNOSIS — Z96641 Presence of right artificial hip joint: Secondary | ICD-10-CM

## 2021-07-31 MED ORDER — MELOXICAM 7.5 MG PO TABS: 7.5000 mg | ORAL_TABLET | Freq: Two times a day (BID) | ORAL | 1 refills | Status: DC

## 2021-07-31 MED ORDER — METHOCARBAMOL 500 MG PO TABS: 500.0000 mg | ORAL_TABLET | Freq: Four times a day (QID) | ORAL | 0 refills | Status: DC | PRN

## 2021-07-31 NOTE — Progress Notes (Signed)
? ? ?  HPI: [name] returns today 2 weeks status post right total hip arthroplasty.  She states overall she is doing great.  She is walking with a cane.  She was placed on Eliquis due to a family history of PEs but no personal history of PE.  She rates her pain to be 6-8 out of 10 pain at worst.  She is still taking periodic Roxicodone, Tylenol and Robaxin.  She had no fevers or chills. ? ?Physical exam: General well-developed well-nourished female no acute distress ambulates with a cane.  Was able to get on and off the exam table on her own. ?Right hip: Surgical incisions healing well no signs of infection or dehiscence.  Staples approximate the wound.  Dorsiflexion plantarflexion right ankle intact. ? ?Impression: Status post right total hip arthroplasty 07/19/2021 ? ?Plan: She can stop her Eliquis.  She will go on a daily aspirin 325 mg for the next 2 weeks.  Refill on her oxycodone Robaxin were given.  Also at her request refill on her Mobic she will go on this when she is finished with the aspirin.  Questions were encouraged and answered by Dr. Ninfa Linden and myself.  Staples removed Steri-Strips applied.  Scar tissue mobilization encouraged. ? ? ? ? ? ? ? ? ? ? ? ? ? ? ? ? ? ? ? ? ? ? ? ? ? ? ? ? ? ? ? ? ? ? ? ? ? ? ? ? ? ? ? ? ? ? ? ? ? ? ? ? ? ? ? ? ? ? ? ? ? ? ? ? ? ? ? ? ? ? ? ? ? ? ? ?

## 2021-08-01 ENCOUNTER — Encounter: Payer: Commercial Managed Care - PPO | Admitting: Orthopaedic Surgery

## 2021-08-16 ENCOUNTER — Telehealth: Payer: Self-pay | Admitting: Orthopaedic Surgery

## 2021-08-16 ENCOUNTER — Other Ambulatory Visit: Payer: Self-pay | Admitting: Orthopaedic Surgery

## 2021-08-16 NOTE — Telephone Encounter (Signed)
Pt is needing a return to work note -going back to work in Monday 08-19-21.  She does realize she is calling at the last minute.

## 2021-08-16 NOTE — Telephone Encounter (Signed)
Is this ok?

## 2021-08-16 NOTE — Telephone Encounter (Signed)
Note written patient aware  

## 2021-08-28 ENCOUNTER — Ambulatory Visit (INDEPENDENT_AMBULATORY_CARE_PROVIDER_SITE_OTHER): Payer: Commercial Managed Care - PPO | Admitting: Orthopaedic Surgery

## 2021-08-28 ENCOUNTER — Encounter: Payer: Commercial Managed Care - PPO | Admitting: Orthopaedic Surgery

## 2021-08-28 ENCOUNTER — Encounter: Payer: Self-pay | Admitting: Orthopaedic Surgery

## 2021-08-28 DIAGNOSIS — Z96641 Presence of right artificial hip joint: Secondary | ICD-10-CM

## 2021-08-28 DIAGNOSIS — M1612 Unilateral primary osteoarthritis, left hip: Secondary | ICD-10-CM

## 2021-08-28 NOTE — Progress Notes (Signed)
The patient is now 6 weeks status post a right total hip arthroplasty.  She is only 56 years old and has severe end-stage arthritis of her left hip and is in need of replacement of the right hip at some point.  She is already back to work.  She is doing well overall.  She has had some slight clicking and catching in the right operative hip but I have let her know that that is certainly normal from my standpoint.  The hip does not appear unstable.  It does move smoothly and fluidly on the right side.  Her left hip has significant stiffness with internal and external rotation with known severe in chair arthritis of the left hip.  She will continue increase her activities as comfort allows.  I will see her back in 3 months to see how she is doing overall.  I would like a standing low AP pelvis at that visit.  We can then work on considering scheduling her for surgery for her left hip.

## 2021-09-03 ENCOUNTER — Ambulatory Visit
Admission: RE | Admit: 2021-09-03 | Discharge: 2021-09-03 | Disposition: A | Discharge status: Home / Self Care | Payer: Commercial Managed Care - PPO | Source: Ambulatory Visit | Attending: Nurse Practitioner | Admitting: Nurse Practitioner

## 2021-09-03 DIAGNOSIS — Z1231 Encounter for screening mammogram for malignant neoplasm of breast: Secondary | ICD-10-CM

## 2021-10-11 ENCOUNTER — Encounter: Payer: Self-pay | Admitting: Orthopaedic Surgery

## 2021-10-11 ENCOUNTER — Other Ambulatory Visit: Payer: Self-pay | Admitting: Orthopaedic Surgery

## 2021-10-11 MED ORDER — OXYCODONE HCL 5 MG PO TABS: 5.0000 mg | ORAL_TABLET | Freq: Every day | ORAL | 0 refills | Status: DC | PRN

## 2021-11-14 ENCOUNTER — Other Ambulatory Visit: Payer: Self-pay | Admitting: Orthopaedic Surgery

## 2021-11-14 ENCOUNTER — Telehealth: Payer: Self-pay | Admitting: Orthopaedic Surgery

## 2021-11-14 MED ORDER — OXYCODONE HCL 5 MG PO TABS: 5.0000 mg | ORAL_TABLET | Freq: Every day | ORAL | 0 refills | Status: DC | PRN

## 2021-11-14 NOTE — Telephone Encounter (Signed)
Please advise 

## 2021-11-14 NOTE — Telephone Encounter (Signed)
Pt called requesting a refill of oxycodone. Please send to pharmacy on file. Pt phone number is [phone]

## 2021-11-18 ENCOUNTER — Telehealth: Payer: Self-pay | Admitting: Orthopaedic Surgery

## 2021-11-18 ENCOUNTER — Other Ambulatory Visit: Payer: Self-pay | Admitting: Orthopaedic Surgery

## 2021-11-18 MED ORDER — HYDROCODONE-ACETAMINOPHEN 5-325 MG PO TABS: 1.0000 | ORAL_TABLET | Freq: Three times a day (TID) | ORAL | 0 refills | Status: DC | PRN

## 2021-11-18 NOTE — Telephone Encounter (Signed)
Patient aware.

## 2021-11-18 NOTE — Telephone Encounter (Signed)
Patient called advised she is out of her pain medication. Please see previous message from patient.   Patient # [phone]

## 2021-11-27 ENCOUNTER — Ambulatory Visit (INDEPENDENT_AMBULATORY_CARE_PROVIDER_SITE_OTHER): Payer: Commercial Managed Care - PPO

## 2021-11-27 ENCOUNTER — Encounter: Payer: Self-pay | Admitting: Orthopaedic Surgery

## 2021-11-27 ENCOUNTER — Ambulatory Visit (INDEPENDENT_AMBULATORY_CARE_PROVIDER_SITE_OTHER): Payer: Commercial Managed Care - PPO | Admitting: Orthopaedic Surgery

## 2021-11-27 VITALS — Ht 62.5 in | Wt 208.6 lb

## 2021-11-27 DIAGNOSIS — Z96641 Presence of right artificial hip joint: Secondary | ICD-10-CM

## 2021-11-27 DIAGNOSIS — M1612 Unilateral primary osteoarthritis, left hip: Secondary | ICD-10-CM | POA: Diagnosis not present

## 2021-11-27 NOTE — Progress Notes (Signed)
The patient is now over 4 months status post a right total hip arthroplasty.  She is a very active 56 year old female.  She has well-documented severe end-stage arthritis of her left hip and is thinking about proceeding with left hip replacement at some point.  She is already tried and failed all forms of conservative treatment for her left hip including physical therapy, anti-inflammatories, activity modification and time.  X-rays today standing show both hips.  The right hip shows a well-seated total hip arthroplasty.  The left hip shows severe end-stage bone-on-bone arthritis with flattening of the femoral head and left side.  There are sclerotic changes in the femoral head and osteophytes and complete loss of joint space.  This is severe end-stage arthritis of the left hip.  Her right total hip arthroplasty looks good and well-seated.  Her right operative hip moves smoothly.  Her left hip has decreased in rotation with pain also in the groin with rotation.  At this point she will let us know when we proceed with a left hip replacement.  I am happy to schedule this for her at any time at this point given her x-ray and clinical exam findings.  From the standpoint of right hip, I do not need to see her back for 6 months with a standing AP pelvis and lateral of the right hip.

## 2021-12-11 ENCOUNTER — Other Ambulatory Visit: Payer: Self-pay

## 2021-12-26 ENCOUNTER — Other Ambulatory Visit: Payer: Self-pay | Admitting: Physician Assistant

## 2021-12-26 NOTE — Patient Instructions (Addendum)
DUE TO COVID-19 ONLY TWO VISITORS  (aged 56 and older)  ARE ALLOWED TO COME WITH YOU AND STAY IN THE WAITING ROOM ONLY DURING PRE OP AND PROCEDURE.   **NO VISITORS ARE ALLOWED IN THE SHORT STAY AREA OR RECOVERY ROOM!!**  IF YOU WILL BE ADMITTED INTO THE HOSPITAL YOU ARE ALLOWED ONLY FOUR SUPPORT PEOPLE DURING VISITATION HOURS ONLY (7 AM -8PM)   The support person(s) must pass our screening, gel in and out, and wear a mask at all times, including in the patient's room. Patients must also wear a mask when staff or their support person are in the room. Visitors GUEST BADGE MUST BE WORN VISIBLY  One adult visitor may remain with you overnight and MUST be in the room by 8 P.M.     Your procedure is scheduled on: 01/10/22   Report to [facility] Main Entrance    Report to admitting at : 5:30 AM   Call this number if you have problems the morning of surgery [phone]   Do not eat food :After Midnight.   After Midnight you may have the following liquids until : 5:00 AM DAY OF SURGERY  Water Black Coffee (sugar ok, NO MILK/CREAM OR CREAMERS)  Tea (sugar ok, NO MILK/CREAM OR CREAMERS) regular and decaf                             Plain Jell-O (NO RED)                                           Fruit ices (not with fruit pulp, NO RED)                                     Popsicles (NO RED)                                                                  Juice: apple, WHITE grape, WHITE cranberry Sports drinks like Gatorade (NO RED)              Drink  Ensure drink AT:  5:00 AM the day of surgery.     The day of surgery:  Drink ONE (1) Pre-Surgery Clear Ensure or G2 at AM the morning of surgery. Drink in one sitting. Do not sip.  This drink was given to you during your hospital  pre-op appointment visit. Nothing else to drink after completing the  Pre-Surgery Clear Ensure or G2.          If you have questions, please contact your surgeon's office.   Oral Hygiene is also  important to reduce your risk of infection.                                    Remember - BRUSH YOUR TEETH THE MORNING OF SURGERY WITH YOUR REGULAR TOOTHPASTE   Do NOT smoke after Midnight   Take these medicines the morning of surgery with A  SIP OF WATER: Tylenol as needed.  DO NOT TAKE ANY ORAL DIABETIC MEDICATIONS DAY OF YOUR SURGERY  Bring CPAP mask and tubing day of surgery.                              You may not have any metal on your body including hair pins, jewelry, and body piercing             Do not wear make-up, lotions, powders, perfumes/cologne, or deodorant  Do not wear nail polish including gel and S&S, artificial/acrylic nails, or any other type of covering on natural nails including finger and toenails. If you have artificial nails, gel coating, etc. that needs to be removed by a nail salon please have this removed prior to surgery or surgery may need to be canceled/ delayed if the surgeon/ anesthesia feels like they are unable to be safely monitored.   Do not shave  48 hours prior to surgery.    Do not bring valuables to the hospital. [place].   Contacts, dentures or bridgework may not be worn into surgery.   Bring small overnight bag day of surgery.   DO NOT [place]. PHARMACY WILL DISPENSE MEDICATIONS LISTED ON YOUR MEDICATION LIST TO YOU DURING YOUR ADMISSION [place]!    Patients discharged on the day of surgery will not be allowed to drive home.  Someone NEEDS to stay with you for the first 24 hours after anesthesia.   Special Instructions: Bring a copy of your healthcare power of attorney and living will documents         the day of surgery if you haven't scanned them before.              Please read over the following fact sheets you were given: IF YOU HAVE QUESTIONS ABOUT YOUR PRE-OP INSTRUCTIONS PLEASE CALL [phone]     [facility] - Preparing for  Surgery Before surgery, you can play an important role.  Because skin is not sterile, your skin needs to be as free of germs as possible.  You can reduce the number of germs on your skin by washing with CHG (chlorahexidine gluconate) soap before surgery.  CHG is an antiseptic cleaner which kills germs and bonds with the skin to continue killing germs even after washing. Please DO NOT use if you have an allergy to CHG or antibacterial soaps.  If your skin becomes reddened/irritated stop using the CHG and inform your nurse when you arrive at Short Stay. Do not shave (including legs and underarms) for at least 48 hours prior to the first CHG shower.  You may shave your face/neck. Please follow these instructions carefully:  1.  Shower with CHG Soap the night before surgery and the  morning of Surgery.  2.  If you choose to wash your hair, wash your hair first as usual with your  normal  shampoo.  3.  After you shampoo, rinse your hair and body thoroughly to remove the  shampoo.                           4.  Use CHG as you would any other liquid soap.  You can apply chg directly  to the skin and wash  Gently with a scrungie or clean washcloth.  5.  Apply the CHG Soap to your body ONLY FROM THE NECK DOWN.   Do not use on face/ open                           Wound or open sores. Avoid contact with eyes, ears mouth and genitals (private parts).                       Wash face,  Genitals (private parts) with your normal soap.             6.  Wash thoroughly, paying special attention to the area where your surgery  will be performed.  7.  Thoroughly rinse your body with warm water from the neck down.  8.  DO NOT shower/wash with your normal soap after using and rinsing off  the CHG Soap.                9.  Pat yourself dry with a clean towel.            10.  Wear clean pajamas.            11.  Place clean sheets on your bed the night of your first shower and do not  sleep with pets. Day  of Surgery : Do not apply any lotions/deodorants the morning of surgery.  Please wear clean clothes to the hospital/surgery center.  FAILURE TO FOLLOW THESE INSTRUCTIONS MAY RESULT IN THE CANCELLATION OF YOUR SURGERY PATIENT SIGNATURE_________________________________  NURSE SIGNATURE__________________________________  ________________________________________________________________________  [name]  An incentive spirometer is a tool that can help keep your lungs clear and active. This tool measures how well you are filling your lungs with each breath. Taking long deep breaths may help reverse or decrease the chance of developing breathing (pulmonary) problems (especially infection) following: A long period of time when you are unable to move or be active. BEFORE THE PROCEDURE  If the spirometer includes an indicator to show your best effort, your nurse or respiratory therapist will set it to a desired goal. If possible, sit up straight or lean slightly forward. Try not to slouch. Hold the incentive spirometer in an upright position. INSTRUCTIONS FOR USE  Sit on the edge of your bed if possible, or sit up as far as you can in bed or on a chair. Hold the incentive spirometer in an upright position. Breathe out normally. Place the mouthpiece in your mouth and seal your lips tightly around it. Breathe in slowly and as deeply as possible, raising the piston or the ball toward the top of the column. Hold your breath for 3-5 seconds or for as long as possible. Allow the piston or ball to fall to the bottom of the column. Remove the mouthpiece from your mouth and breathe out normally. Rest for a few seconds and repeat Steps 1 through 7 at least 10 times every 1-2 hours when you are awake. Take your time and take a few normal breaths between deep breaths. The spirometer may include an indicator to show your best effort. Use the indicator as a goal to work toward during each  repetition. After each set of 10 deep breaths, practice coughing to be sure your lungs are clear. If you have an incision (the cut made at the time of surgery), support your incision when coughing by placing a pillow or rolled up towels firmly  against it. Once you are able to get out of bed, walk around indoors and cough well. You may stop using the incentive spirometer when instructed by your caregiver.  RISKS AND COMPLICATIONS Take your time so you do not get dizzy or light-headed. If you are in pain, you may need to take or ask for pain medication before doing incentive spirometry. It is harder to take a deep breath if you are having pain. AFTER USE Rest and breathe slowly and easily. It can be helpful to keep track of a log of your progress. Your caregiver can provide you with a simple table to help with this. If you are using the spirometer at home, follow these instructions: [place] IF:  You are having difficultly using the spirometer. You have trouble using the spirometer as often as instructed. Your pain medication is not giving enough relief while using the spirometer. You develop fever of 100.5 F (38.1 C) or higher. SEEK IMMEDIATE MEDICAL CARE IF:  You cough up bloody sputum that had not been present before. You develop fever of 102 F (38.9 C) or greater. You develop worsening pain at or near the incision site. MAKE SURE YOU:  Understand these instructions. Will watch your condition. Will get help right away if you are not doing well or get worse. Document Released: 07/28/2006 Document Revised: 06/09/2011 Document Reviewed: 09/28/2006 [facility] Patient Information 2014 [place], [place].   ________________________________________________________________________

## 2021-12-30 ENCOUNTER — Encounter (HOSPITAL_COMMUNITY)
Admission: RE | Admit: 2021-12-30 | Discharge: 2021-12-30 | Disposition: A | Discharge status: Home / Self Care | Payer: Commercial Managed Care - PPO | Source: Ambulatory Visit | Attending: Orthopaedic Surgery | Admitting: Orthopaedic Surgery

## 2021-12-30 ENCOUNTER — Other Ambulatory Visit: Payer: Self-pay

## 2021-12-30 ENCOUNTER — Encounter (HOSPITAL_COMMUNITY): Payer: Self-pay

## 2021-12-30 VITALS — BP 145/97 | HR 91 | Temp 98.4°F | Ht 62.5 in | Wt 203.0 lb

## 2021-12-30 DIAGNOSIS — Z01812 Encounter for preprocedural laboratory examination: Secondary | ICD-10-CM | POA: Diagnosis present

## 2021-12-30 DIAGNOSIS — Z01818 Encounter for other preprocedural examination: Secondary | ICD-10-CM

## 2021-12-30 LAB — SURGICAL PCR SCREEN
MRSA, PCR: NEGATIVE
Staphylococcus aureus: NEGATIVE

## 2021-12-30 LAB — CBC
HCT: 42.8 % (ref 36.0–46.0)
Hemoglobin: 13.9 g/dL (ref 12.0–15.0)
MCH: 29.8 pg (ref 26.0–34.0)
MCHC: 32.5 g/dL (ref 30.0–36.0)
MCV: 91.6 fL (ref 80.0–100.0)
Platelets: 256 10*3/uL (ref 150–400)
RBC: 4.67 MIL/uL (ref 3.87–5.11)
RDW: 12.7 % (ref 11.5–15.5)
WBC: 7 10*3/uL (ref 4.0–10.5)
nRBC: 0 % (ref 0.0–0.2)

## 2021-12-30 NOTE — Progress Notes (Signed)
For Short Stay: [facility] [place] appointment date: Date of COVID positive in last 55 days:  Bowel Prep reminder:   For [name] : NP Cardiologist -   Chest x-ray -  EKG -  Stress Test -  ECHO -  Cardiac Cath -  Pacemaker/ICD device last checked: Pacemaker orders received: Device Rep notified:  Spinal Cord Stimulator:  Sleep Study -  CPAP -   Fasting Blood Sugar -  Checks Blood Sugar _____ times a day Date and result of last Hgb A1c-  Blood Thinner Instructions: Aspirin Instructions: Last Dose:  Activity level: Can go up a flight of stairs and activities of daily living without stopping and without chest pain and/or shortness of breath   Able to exercise without chest pain and/or shortness of breath   Unable to go up a flight of stairs without chest pain and/or shortness of breath     Anesthesia review:   Patient denies shortness of breath, fever, cough and chest pain at PAT appointment   Patient verbalized understanding of instructions that were given to them at the PAT appointment. Patient was also instructed that they will need to review over the PAT instructions again at home before surgery.

## 2021-12-31 LAB — TYPE AND SCREEN
ABO/RH(D): O POS
Antibody Screen: NEGATIVE

## 2022-01-02 ENCOUNTER — Telehealth: Payer: Self-pay | Admitting: Orthopaedic Surgery

## 2022-01-02 NOTE — Telephone Encounter (Signed)
[employer] forms received. To [employer].

## 2022-01-03 ENCOUNTER — Telehealth: Payer: Self-pay | Admitting: Orthopaedic Surgery

## 2022-01-03 NOTE — Telephone Encounter (Signed)
FMLA forms received. To [employer].

## 2022-01-06 ENCOUNTER — Telehealth: Payer: Self-pay | Admitting: Orthopaedic Surgery

## 2022-01-06 NOTE — Telephone Encounter (Signed)
Authorizations & $50 cash paid to [employer] for FMLA & STD forms.

## 2022-01-09 NOTE — H&P (Signed)
TOTAL HIP ADMISSION H&P  Patient is admitted for left total hip arthroplasty.  Subjective:  Chief Complaint: left hip pain  HPI: [name], 56 y.o. female, has a history of pain and functional disability in the left hip(s) due to arthritis and patient has failed non-surgical conservative treatments for greater than 12 weeks to include NSAID's and/or analgesics, corticosteriod injections, flexibility and strengthening excercises, use of assistive devices, weight reduction as appropriate, and activity modification.  Onset of symptoms was gradual starting 2 years ago with gradually worsening course since that time.The patient noted no past surgery on the left hip(s).  Patient currently rates pain in the left hip at 10 out of 10 with activity. Patient has night pain, worsening of pain with activity and weight bearing, pain that interfers with activities of daily living, and pain with passive range of motion. Patient has evidence of subchondral cysts, subchondral sclerosis, periarticular osteophytes, and joint space narrowing by imaging studies. This condition presents safety issues increasing the risk of falls.  There is no current active infection.  Patient Active Problem List   Diagnosis Date Noted   Status post total replacement of right hip 07/19/2021   Unilateral primary osteoarthritis, left hip 04/24/2021   Hx of colonic polyp 07/14/2017   Hx of endometriosis 07/14/2017   Past Medical History:  Diagnosis Date   Arthritis    Colon polyps    Endometriosis    Fibroid, uterine    GERD (gastroesophageal reflux disease)    H. pylori infection    HLD (hyperlipidemia)     Past Surgical History:  Procedure Laterality Date   ABDOMINAL HYSTERECTOMY     LAPAROSCOPY     x 2   TONSILLECTOMY     TOTAL HIP ARTHROPLASTY Right 07/19/2021   Procedure: RIGHT TOTAL HIP ARTHROPLASTY ANTERIOR APPROACH;  Surgeon: Mcarthur Rossetti, MD;  Location: [facility];  Service: Orthopedics;  Laterality:  Right;    No current facility-administered medications for this encounter.   Current Outpatient Medications  Medication Sig Dispense Refill Last Dose   acetaminophen (TYLENOL) 500 MG tablet Take 1,000 mg by mouth 3 (three) times daily as needed for moderate pain.      lovastatin (MEVACOR) 20 MG tablet Take 20 mg by mouth every evening.  0    meloxicam (MOBIC) 15 MG tablet Take 15 mg by mouth daily.      Multiple Vitamin (MULTIVITAMIN) tablet Take 1 tablet by mouth daily.      OVER THE COUNTER MEDICATION Take 1 capsule by mouth daily. Circulation and Vein Support      OVER THE COUNTER MEDICATION Take 1 capsule by mouth daily. Complete Joint Effort      OVER THE COUNTER MEDICATION Take 1 tablet by mouth daily. Cholestacare supplement      oxyCODONE (OXY IR/ROXICODONE) 5 MG immediate release tablet Take 5 mg by mouth daily as needed for severe pain.      Probiotic Product (PHILLIPS COLON HEALTH PO) Take 1 tablet by mouth daily.      PSYLLIUM PO Take 1 capsule by mouth daily.      zinc gluconate 50 MG tablet Take 50 mg by mouth daily.      ELIQUIS 2.5 MG TABS tablet TAKE 1 TABLET BY MOUTH TWICE A DAY (Patient not taking: Reported on 12/26/2021) 60 tablet 0 Completed Course   HYDROcodone-acetaminophen (NORCO/VICODIN) 5-325 MG tablet Take 1 tablet by mouth 3 (three) times daily as needed for moderate pain. (Patient not taking: Reported on 12/26/2021)  30 tablet 0 Not Taking   meloxicam (MOBIC) 7.5 MG tablet Take 1 tablet (7.5 mg total) by mouth 2 (two) times daily. (Patient not taking: Reported on 12/26/2021) 60 tablet 1 Not Taking   methocarbamol (ROBAXIN) 500 MG tablet Take 1 tablet (500 mg total) by mouth every 6 (six) hours as needed for muscle spasms. (Patient not taking: Reported on 12/26/2021) 60 tablet 0 Completed Course   No Known Allergies  Social History   Tobacco Use   Smoking status: Former    Types: Cigarettes    Quit date: 2005    Years since quitting: 18.7   Smokeless tobacco:  Never  Substance Use Topics   Alcohol use: Yes    Comment: daily beer    Family History  Problem Relation Age of Onset   Clotting disorder Mother    Clotting disorder Brother    Colon cancer Paternal Aunt    Heart disease Paternal Uncle    Heart disease Maternal Uncle      Review of Systems  All other systems reviewed and are negative.   Objective:  Physical Exam Vitals reviewed.  Constitutional:      Appearance: Normal appearance.  HENT:     Head: Normocephalic and atraumatic.  Eyes:     Extraocular Movements: Extraocular movements intact.     Pupils: Pupils are equal, round, and reactive to light.  Cardiovascular:     Rate and Rhythm: Normal rate and regular rhythm.  Pulmonary:     Effort: Pulmonary effort is normal.     Breath sounds: Normal breath sounds.  Abdominal:     General: Abdomen is flat.     Palpations: Abdomen is soft.  Musculoskeletal:     Cervical back: Normal range of motion and neck supple.     Left hip: Tenderness and bony tenderness present. Decreased range of motion. Decreased strength.  Neurological:     Mental Status: She is alert and oriented to person, place, and time.  Psychiatric:        Behavior: Behavior normal.     Vital signs in last 24 hours:    Labs:   Estimated body mass index is 36.54 kg/m as calculated from the following:   Height as of 12/30/21: 5' 2.5" (1.588 m).   Weight as of 12/30/21: 92.1 kg.   Imaging Review Plain radiographs demonstrate severe degenerative joint disease of the left hip(s). The bone quality appears to be excellent for age and reported activity level.      Assessment/Plan:  End stage arthritis, left hip(s)  The patient history, physical examination, clinical judgement of the provider and imaging studies are consistent with end stage degenerative joint disease of the left hip(s) and total hip arthroplasty is deemed medically necessary. The treatment options including medical management,  injection therapy, arthroscopy and arthroplasty were discussed at length. The risks and benefits of total hip arthroplasty were presented and reviewed. The risks due to aseptic loosening, infection, stiffness, dislocation/subluxation,  thromboembolic complications and other imponderables were discussed.  The patient acknowledged the explanation, agreed to proceed with the plan and consent was signed. Patient is being admitted for inpatient treatment for surgery, pain control, PT, OT, prophylactic antibiotics, VTE prophylaxis, progressive ambulation and ADL's and discharge planning.The patient is planning to be discharged home with home health services

## 2022-01-10 ENCOUNTER — Observation Stay (HOSPITAL_COMMUNITY): Payer: Commercial Managed Care - PPO

## 2022-01-10 ENCOUNTER — Encounter (HOSPITAL_COMMUNITY)
Admission: RE | Disposition: A | Discharge status: Home Health | Payer: Self-pay | Source: Home / Self Care | Attending: Orthopaedic Surgery

## 2022-01-10 ENCOUNTER — Ambulatory Visit (HOSPITAL_COMMUNITY): Payer: Commercial Managed Care - PPO

## 2022-01-10 ENCOUNTER — Ambulatory Visit (HOSPITAL_BASED_OUTPATIENT_CLINIC_OR_DEPARTMENT_OTHER): Payer: Commercial Managed Care - PPO | Admitting: Anesthesiology

## 2022-01-10 ENCOUNTER — Observation Stay (HOSPITAL_COMMUNITY)
Admission: RE | Admit: 2022-01-10 | Discharge: 2022-01-11 | Disposition: A | Discharge status: Home Health | Payer: Commercial Managed Care - PPO | Attending: Orthopaedic Surgery | Admitting: Orthopaedic Surgery

## 2022-01-10 ENCOUNTER — Ambulatory Visit (HOSPITAL_COMMUNITY): Payer: Commercial Managed Care - PPO | Admitting: Anesthesiology

## 2022-01-10 ENCOUNTER — Other Ambulatory Visit: Payer: Self-pay

## 2022-01-10 ENCOUNTER — Encounter (HOSPITAL_COMMUNITY): Payer: Self-pay | Admitting: Orthopaedic Surgery

## 2022-01-10 DIAGNOSIS — M1612 Unilateral primary osteoarthritis, left hip: Secondary | ICD-10-CM | POA: Diagnosis present

## 2022-01-10 DIAGNOSIS — Z96641 Presence of right artificial hip joint: Secondary | ICD-10-CM | POA: Insufficient documentation

## 2022-01-10 DIAGNOSIS — I10 Essential (primary) hypertension: Secondary | ICD-10-CM | POA: Insufficient documentation

## 2022-01-10 DIAGNOSIS — Z96642 Presence of left artificial hip joint: Secondary | ICD-10-CM

## 2022-01-10 DIAGNOSIS — Z79899 Other long term (current) drug therapy: Secondary | ICD-10-CM | POA: Diagnosis not present

## 2022-01-10 DIAGNOSIS — Z7901 Long term (current) use of anticoagulants: Secondary | ICD-10-CM | POA: Insufficient documentation

## 2022-01-10 DIAGNOSIS — Z87891 Personal history of nicotine dependence: Secondary | ICD-10-CM | POA: Diagnosis not present

## 2022-01-10 HISTORY — PX: TOTAL HIP ARTHROPLASTY: SHX124

## 2022-01-10 SURGERY — ARTHROPLASTY, HIP, TOTAL, ANTERIOR APPROACH
Anesthesia: Spinal | Site: Hip | Laterality: Left

## 2022-01-10 MED ORDER — FENTANYL CITRATE (PF) 100 MCG/2ML IJ SOLN
INTRAMUSCULAR | Status: DC | PRN
  Administered 2022-01-10: 50 ug via INTRAVENOUS

## 2022-01-10 MED ORDER — FENTANYL CITRATE PF 50 MCG/ML IJ SOSY: 25.0000 ug | PREFILLED_SYRINGE | INTRAMUSCULAR | Status: DC | PRN

## 2022-01-10 MED ORDER — DEXAMETHASONE SODIUM PHOSPHATE 10 MG/ML IJ SOLN
INTRAMUSCULAR | Status: DC | PRN
  Administered 2022-01-10: 10 mg via INTRAVENOUS

## 2022-01-10 MED ORDER — CHLORHEXIDINE GLUCONATE 0.12 % MT SOLN
15.0000 mL | Freq: Once | OROMUCOSAL | Status: AC
  Administered 2022-01-10: 15 mL via OROMUCOSAL

## 2022-01-10 MED ORDER — SODIUM CHLORIDE 0.9 % IV SOLN: INTRAVENOUS | Status: DC

## 2022-01-10 MED ORDER — POLYETHYLENE GLYCOL 3350 17 G PO PACK: 17.0000 g | PACK | Freq: Every day | ORAL | Status: DC | PRN

## 2022-01-10 MED ORDER — CEFAZOLIN SODIUM-DEXTROSE 1-4 GM/50ML-% IV SOLN
1.0000 g | Freq: Four times a day (QID) | INTRAVENOUS | Status: AC
  Administered 2022-01-10 (×2): 1 g via INTRAVENOUS
  Filled 2022-01-10 (×2): qty 50

## 2022-01-10 MED ORDER — OXYCODONE HCL 5 MG PO TABS
5.0000 mg | ORAL_TABLET | ORAL | Status: DC | PRN
  Administered 2022-01-10 (×2): 10 mg via ORAL
  Administered 2022-01-11 (×2): 5 mg via ORAL
  Filled 2022-01-10 (×2): qty 1
  Filled 2022-01-10 (×3): qty 2

## 2022-01-10 MED ORDER — ONDANSETRON HCL 4 MG/2ML IJ SOLN: 4.0000 mg | Freq: Once | INTRAMUSCULAR | Status: DC | PRN

## 2022-01-10 MED ORDER — PANTOPRAZOLE SODIUM 40 MG PO TBEC
40.0000 mg | DELAYED_RELEASE_TABLET | Freq: Every day | ORAL | Status: DC
  Administered 2022-01-10 – 2022-01-11 (×2): 40 mg via ORAL
  Filled 2022-01-10 (×2): qty 1

## 2022-01-10 MED ORDER — SODIUM CHLORIDE 0.9 % IR SOLN
Status: DC | PRN
  Administered 2022-01-10: 1000 mL

## 2022-01-10 MED ORDER — PROPOFOL 10 MG/ML IV BOLUS
INTRAVENOUS | Status: AC
  Filled 2022-01-10: qty 20

## 2022-01-10 MED ORDER — PHENYLEPHRINE HCL (PRESSORS) 10 MG/ML IV SOLN
INTRAVENOUS | Status: DC | PRN
  Administered 2022-01-10 (×2): 80 ug via INTRAVENOUS

## 2022-01-10 MED ORDER — 0.9 % SODIUM CHLORIDE (POUR BTL) OPTIME
TOPICAL | Status: DC | PRN
  Administered 2022-01-10: 1000 mL

## 2022-01-10 MED ORDER — TRANEXAMIC ACID-NACL 1000-0.7 MG/100ML-% IV SOLN
1000.0000 mg | INTRAVENOUS | Status: AC
  Administered 2022-01-10: 1000 mg via INTRAVENOUS
  Filled 2022-01-10: qty 100

## 2022-01-10 MED ORDER — STERILE WATER FOR IRRIGATION IR SOLN
Status: DC | PRN
  Administered 2022-01-10: 2000 mL

## 2022-01-10 MED ORDER — PHENYLEPHRINE HCL-NACL 20-0.9 MG/250ML-% IV SOLN
INTRAVENOUS | Status: AC
  Filled 2022-01-10: qty 500

## 2022-01-10 MED ORDER — ACETAMINOPHEN 500 MG PO TABS: 1000.0000 mg | ORAL_TABLET | Freq: Once | ORAL | Status: DC

## 2022-01-10 MED ORDER — PHENOL 1.4 % MT LIQD: 1.0000 | OROMUCOSAL | Status: DC | PRN

## 2022-01-10 MED ORDER — PROPOFOL 10 MG/ML IV BOLUS
INTRAVENOUS | Status: DC | PRN
  Administered 2022-01-10: 20 mg via INTRAVENOUS

## 2022-01-10 MED ORDER — PHENYLEPHRINE HCL-NACL 20-0.9 MG/250ML-% IV SOLN
INTRAVENOUS | Status: DC | PRN
  Administered 2022-01-10: 50 ug/min via INTRAVENOUS

## 2022-01-10 MED ORDER — FENTANYL CITRATE (PF) 100 MCG/2ML IJ SOLN
INTRAMUSCULAR | Status: AC
  Filled 2022-01-10: qty 2

## 2022-01-10 MED ORDER — ACETAMINOPHEN 325 MG PO TABS
325.0000 mg | ORAL_TABLET | Freq: Four times a day (QID) | ORAL | Status: DC | PRN
  Administered 2022-01-11 (×2): 650 mg via ORAL
  Filled 2022-01-10 (×3): qty 2

## 2022-01-10 MED ORDER — POVIDONE-IODINE 10 % EX SWAB
2.0000 | Freq: Once | CUTANEOUS | Status: AC
  Administered 2022-01-10: 2 via TOPICAL

## 2022-01-10 MED ORDER — MIDAZOLAM HCL 2 MG/2ML IJ SOLN
INTRAMUSCULAR | Status: AC
  Filled 2022-01-10: qty 2

## 2022-01-10 MED ORDER — PROPOFOL 500 MG/50ML IV EMUL
INTRAVENOUS | Status: DC | PRN
  Administered 2022-01-10: 75 ug/kg/min via INTRAVENOUS

## 2022-01-10 MED ORDER — AMISULPRIDE (ANTIEMETIC) 5 MG/2ML IV SOLN: 10.0000 mg | Freq: Once | INTRAVENOUS | Status: DC | PRN

## 2022-01-10 MED ORDER — ONDANSETRON HCL 4 MG PO TABS: 4.0000 mg | ORAL_TABLET | Freq: Four times a day (QID) | ORAL | Status: DC | PRN

## 2022-01-10 MED ORDER — ONDANSETRON HCL 4 MG/2ML IJ SOLN
INTRAMUSCULAR | Status: AC
  Filled 2022-01-10: qty 2

## 2022-01-10 MED ORDER — MIDAZOLAM HCL 5 MG/5ML IJ SOLN
INTRAMUSCULAR | Status: DC | PRN
  Administered 2022-01-10: 2 mg via INTRAVENOUS

## 2022-01-10 MED ORDER — ZINC SULFATE 220 (50 ZN) MG PO CAPS
220.0000 mg | ORAL_CAPSULE | Freq: Every day | ORAL | Status: DC
  Administered 2022-01-10 – 2022-01-11 (×2): 220 mg via ORAL
  Filled 2022-01-10 (×2): qty 1

## 2022-01-10 MED ORDER — ORAL CARE MOUTH RINSE: 15.0000 mL | Freq: Once | OROMUCOSAL | Status: AC

## 2022-01-10 MED ORDER — DIPHENHYDRAMINE HCL 12.5 MG/5ML PO ELIX: 12.5000 mg | ORAL_SOLUTION | ORAL | Status: DC | PRN

## 2022-01-10 MED ORDER — METHOCARBAMOL 500 MG PO TABS
500.0000 mg | ORAL_TABLET | Freq: Four times a day (QID) | ORAL | Status: DC | PRN
  Administered 2022-01-10 – 2022-01-11 (×2): 500 mg via ORAL
  Filled 2022-01-10 (×2): qty 1

## 2022-01-10 MED ORDER — ASPIRIN 81 MG PO CHEW
81.0000 mg | CHEWABLE_TABLET | Freq: Two times a day (BID) | ORAL | Status: DC
  Administered 2022-01-10 – 2022-01-11 (×2): 81 mg via ORAL
  Filled 2022-01-10 (×2): qty 1

## 2022-01-10 MED ORDER — HYDROMORPHONE HCL 1 MG/ML IJ SOLN
0.5000 mg | INTRAMUSCULAR | Status: DC | PRN
  Administered 2022-01-10: 1 mg via INTRAVENOUS
  Filled 2022-01-10: qty 1

## 2022-01-10 MED ORDER — CEFAZOLIN SODIUM-DEXTROSE 2-4 GM/100ML-% IV SOLN
2.0000 g | INTRAVENOUS | Status: AC
  Administered 2022-01-10: 2 g via INTRAVENOUS
  Filled 2022-01-10: qty 100

## 2022-01-10 MED ORDER — METOCLOPRAMIDE HCL 5 MG/ML IJ SOLN: 5.0000 mg | Freq: Three times a day (TID) | INTRAMUSCULAR | Status: DC | PRN

## 2022-01-10 MED ORDER — DOCUSATE SODIUM 100 MG PO CAPS
100.0000 mg | ORAL_CAPSULE | Freq: Two times a day (BID) | ORAL | Status: DC
  Administered 2022-01-10 – 2022-01-11 (×2): 100 mg via ORAL
  Filled 2022-01-10 (×2): qty 1

## 2022-01-10 MED ORDER — BUPIVACAINE IN DEXTROSE 0.75-8.25 % IT SOLN
INTRATHECAL | Status: DC | PRN
  Administered 2022-01-10: 1.8 mL via INTRATHECAL

## 2022-01-10 MED ORDER — ADULT MULTIVITAMIN W/MINERALS CH
1.0000 | ORAL_TABLET | Freq: Every day | ORAL | Status: DC
  Administered 2022-01-11: 1 via ORAL
  Filled 2022-01-10: qty 1

## 2022-01-10 MED ORDER — LACTATED RINGERS IV SOLN: INTRAVENOUS | Status: DC

## 2022-01-10 MED ORDER — OXYCODONE HCL 5 MG PO TABS
10.0000 mg | ORAL_TABLET | ORAL | Status: DC | PRN
  Administered 2022-01-11: 10 mg via ORAL

## 2022-01-10 MED ORDER — METHOCARBAMOL 1000 MG/10ML IJ SOLN
500.0000 mg | Freq: Four times a day (QID) | INTRAVENOUS | Status: DC | PRN
  Administered 2022-01-10: 500 mg via INTRAVENOUS
  Filled 2022-01-10: qty 500

## 2022-01-10 MED ORDER — METOCLOPRAMIDE HCL 5 MG PO TABS: 5.0000 mg | ORAL_TABLET | Freq: Three times a day (TID) | ORAL | Status: DC | PRN

## 2022-01-10 MED ORDER — MENTHOL 3 MG MT LOZG: 1.0000 | LOZENGE | OROMUCOSAL | Status: DC | PRN

## 2022-01-10 MED ORDER — ALUM & MAG HYDROXIDE-SIMETH 200-200-20 MG/5ML PO SUSP: 30.0000 mL | ORAL | Status: DC | PRN

## 2022-01-10 MED ORDER — ONDANSETRON HCL 4 MG/2ML IJ SOLN: 4.0000 mg | Freq: Four times a day (QID) | INTRAMUSCULAR | Status: DC | PRN

## 2022-01-10 MED ORDER — ACETAMINOPHEN 10 MG/ML IV SOLN: 1000.0000 mg | Freq: Once | INTRAVENOUS | Status: DC | PRN

## 2022-01-10 SURGICAL SUPPLY — 40 items
BAG COUNTER SPONGE SURGICOUNT (BAG) ×1 IMPLANT
BAG ZIPLOCK 12X15 (MISCELLANEOUS) IMPLANT
BALL HIP CERAMIC (Hips) IMPLANT
BENZOIN TINCTURE PRP APPL 2/3 (GAUZE/BANDAGES/DRESSINGS) IMPLANT
BLADE SAW SGTL 18X1.27X75 (BLADE) ×1 IMPLANT
COVER PERINEAL POST (MISCELLANEOUS) ×1 IMPLANT
COVER SURGICAL LIGHT HANDLE (MISCELLANEOUS) ×1 IMPLANT
CUP SECTOR GRIPTON 50MM (Cup) IMPLANT
DRAPE FOOT SWITCH (DRAPES) ×1 IMPLANT
DRAPE STERI IOBAN 125X83 (DRAPES) ×1 IMPLANT
DRAPE U-SHAPE 47X51 STRL (DRAPES) ×2 IMPLANT
DRSG AQUACEL AG ADV 3.5X10 (GAUZE/BANDAGES/DRESSINGS) ×1 IMPLANT
DURAPREP 26ML APPLICATOR (WOUND CARE) ×1 IMPLANT
ELECT REM PT RETURN 15FT ADLT (MISCELLANEOUS) ×1 IMPLANT
GAUZE XEROFORM 1X8 LF (GAUZE/BANDAGES/DRESSINGS) ×1 IMPLANT
GLOVE BIO SURGEON STRL SZ7.5 (GLOVE) ×1 IMPLANT
GLOVE BIOGEL PI IND STRL 8 (GLOVE) ×2 IMPLANT
GLOVE ECLIPSE 8.0 STRL XLNG CF (GLOVE) ×1 IMPLANT
GOWN STRL REUS W/ TWL XL LVL3 (GOWN DISPOSABLE) ×2 IMPLANT
GOWN STRL REUS W/TWL XL LVL3 (GOWN DISPOSABLE) ×2
HANDPIECE INTERPULSE COAX TIP (DISPOSABLE) ×1
HIP BALL CERAMIC (Hips) ×1 IMPLANT
HOLDER FOLEY CATH W/STRAP (MISCELLANEOUS) ×1 IMPLANT
KIT TURNOVER KIT A (KITS) IMPLANT
LINER ACETABULAR 32X50 (Liner) IMPLANT
PACK ANTERIOR HIP CUSTOM (KITS) ×1 IMPLANT
SCREW 6.5MMX25MM (Screw) IMPLANT
SET HNDPC FAN SPRY TIP SCT (DISPOSABLE) ×1 IMPLANT
STAPLER VISISTAT 35W (STAPLE) IMPLANT
STEM FEM ACTIS HIGH SZ2 (Stem) IMPLANT
STRIP CLOSURE SKIN 1/2X4 (GAUZE/BANDAGES/DRESSINGS) IMPLANT
SUT ETHIBOND NAB CT1 #1 30IN (SUTURE) ×1 IMPLANT
SUT ETHILON 2 0 PS N (SUTURE) IMPLANT
SUT MNCRL AB 4-0 PS2 18 (SUTURE) IMPLANT
SUT VIC AB 0 CT1 36 (SUTURE) ×1 IMPLANT
SUT VIC AB 1 CT1 36 (SUTURE) ×1 IMPLANT
SUT VIC AB 2-0 CT1 27 (SUTURE) ×2
SUT VIC AB 2-0 CT1 TAPERPNT 27 (SUTURE) ×2 IMPLANT
TRAY FOLEY W/BAG SLVR 14FR LF (SET/KITS/TRAYS/PACK) IMPLANT
YANKAUER SUCT BULB TIP NO VENT (SUCTIONS) ×1 IMPLANT

## 2022-01-10 NOTE — Anesthesia Preprocedure Evaluation (Addendum)
Anesthesia Evaluation  Patient identified by MRN, date of birth, ID band Patient awake    Reviewed: Allergy & Precautions, NPO status , Patient's Chart, lab work & pertinent test results  Airway Mallampati: III  TM Distance: >3 FB Neck ROM: Full    Dental no notable dental hx.    Pulmonary former smoker,    Pulmonary exam normal        Cardiovascular negative cardio ROS Normal cardiovascular exam     Neuro/Psych negative neurological ROS  negative psych ROS   GI/Hepatic negative GI ROS, Neg liver ROS,   Endo/Other  negative endocrine ROS  Renal/GU negative Renal ROS     Musculoskeletal  (+) Arthritis ,   Abdominal (+) + obese,   Peds  Hematology  (+) Blood dyscrasia (on Eliquis ), ,   Anesthesia Other Findings OSTEOARTHRITIS LEFT HIP  Reproductive/Obstetrics                            Anesthesia Physical Anesthesia Plan  ASA: 2  Anesthesia Plan: Spinal   Post-op Pain Management:    Induction: Intravenous  PONV Risk Score and Plan: 2 and Ondansetron, Dexamethasone, Propofol infusion, Midazolam and Treatment may vary due to age or medical condition  Airway Management Planned: Simple Face Mask  Additional Equipment:   Intra-op Plan:   Post-operative Plan:   Informed Consent: I have reviewed the patients History and Physical, chart, labs and discussed the procedure including the risks, benefits and alternatives for the proposed anesthesia with the patient or authorized representative who has indicated his/her understanding and acceptance.     Dental advisory given  Plan Discussed with: CRNA  Anesthesia Plan Comments:         Anesthesia Quick Evaluation

## 2022-01-10 NOTE — Op Note (Signed)
Operative Note  Date of operation: 01/10/2022 Preoperative diagnosis: Left hip osteoarthritis Postoperative diagnosis: Same  Procedure: Left direct anterior total hip arthroplasty  Implants: DePuy sector GRIPTION acetabular opponent size 50 with a single screw, size 32+0 polythene liner, size 2 Actis femoral component with high offset, size 32+5 ceramic head ball  Surgeon: Lind Guest.  Ninfa Linden, MD Assistant: Benita Stabile, PA-C  Anesthesia: Spinal EBL: 200 cc Antibiotics: 2 g IV Ancef Complications: None  Indications: The patient is a 56 year old female well-known to me.  She has debilitating arthritis of both her hips.  We successfully replaced her right hip in April of this year.  Her left hip has severe end-stage arthritis well-documented on clinical exam and x-ray findings.  Given the success of her right hip she does wish to have her left hip replaced.  Also her left hip is causing significant mobility issues and pain issues.  Having had the surgery before she is fully aware of the risk of acute blood loss anemia, nerve vessel injury, fracture, infection, DVT, implant failure, dislocation, and wound healing issues.  She understands her goals are hopefully decrease pain, improve mobility and improve quality of life.  Procedure description: After informed consent was obtained and appropriate the left hip was marked, the patient was brought to the operating room and set up on her stretcher where spinal anesthesia was obtained.  She was laid in supine position on the stretcher and a Foley catheter was placed.  Traction boots were placed on both her feet.  She was then placed supine on the Hana fracture table with a perineal post in place in both legs and inline skeletal traction vices no traction applied.  Of note her preoperative leg lengths are equal.  We did get a spot image radiographically with the C arm.  The left hip was then prepped and draped with DuraPrep and sterile drapes.   Timeout is called and she identified as correct patient and correct the left hip.  I then made an incision just inferior and posterior to the anterior superior iliac spine and carried this slightly obliquely down the leg.  Dissection was carried down to the tensor fascia lata muscle and tensor fascia was then divided longitudinally to proceed with direct intraoperative to the hip.  Identified and cauterized circumflex vessels identified the hip capsule opened up the hip capsule finding a moderate joint effusion.  Cobra tractors were placed around the medial lateral femoral neck and a femoral neck cut was made with an oscillating saw just proximal to the lesser trochanter and this was completed with an osteotome.  A corkscrew guide was placed in the femoral head and the femoral head was removed in its entirety and found significant deformity and cartilage loss.  Remnants of the acetabular labrum and other debris removed and I placed a bent Hohmann over the medial acetabular rim.  I then began reaming under regularization from a size 43 reamer and stepwise increments going up to size 49 reamer with all reamers placed under direct visitation in the last reamer was placed in direct fluoroscopy so we can obtain our depth reaming, inclination and anteversion.  I then placed the real DePuy sector GRIPTION acetabular head at size 50 and a single screw.  We went with a 32+0 liner for that size acetabular opponent.  Attention was then turned to the femur.  With the leg externally rotated to 120 degrees, extended and adducted, a Mueller retractor was placed medially and a Acupuncturist behind the  greater trochanter.  The lateral joint capsule was released and used a box cutting osteotome to enter the femoral canal.  Broaching was then started with a size 0 broach going up to a size 2 broach with a size 2 in place we trialed a high offset femoral neck based on her offset anatomy and a 32+1 trial hip ball.  The leg was brought  back over and up and with traction and internal rotation reduced the pelvis.  We felt like based on radiographic and clinical assessment we needed a little more leg length.  We dislocated the hip and with the trial components.  We then placed the real femoral component size 2 with high offset and the real 32+5 ceramic head ball and again reduce this in the acetabulum.  On clinical and radiographic assessment we are pleased with leg length, offset, range of motion and stability.  We then irrigated the soft tissue normal saline solution.  The joint capsule was closed with a #1 Ethibond suture followed by #1 Vicryl to close the tensor fascia, followed to 0 Vicryl to close the deep tissue and 2-0 Vicryl close subcutaneous tissue.  The skin was closed with staples.  Well-padded sterile dressings applied.  The patient was taken off the Hana table and taken to recovery in stable addition with all final counts being correct and no complications noted.  Benita Stabile, PA-C did assisted in entire case from beginning to end and his assistance was medically necessary and crucial for soft tissue management and retraction including helping guide implant placement and a layered closure of the wound.

## 2022-01-10 NOTE — Interval H&P Note (Signed)
History and Physical Interval Note: The patient understands that she is here today for a left hip replacement to treat her left hip osteoarthritis.  There has been no acute or interval change in her medical status.  See H&P.  The risks and benefits of surgery have been discussed in detail and informed consent is obtained.  The left operative hip has been marked.  01/10/2022 7:00 AM  [place] [name]  has presented today for surgery, with the diagnosis of OSTEOARTHRITIS LEFT HIP.  The various methods of treatment have been discussed with the patient and family. After consideration of risks, benefits and other options for treatment, the patient has consented to  Procedure(s): LEFT TOTAL HIP ARTHROPLASTY ANTERIOR APPROACH (Left) as a surgical intervention.  The patient's history has been reviewed, patient examined, no change in status, stable for surgery.  I have reviewed the patient's chart and labs.  Questions were answered to the patient's satisfaction.     Mcarthur Rossetti

## 2022-01-10 NOTE — Anesthesia Procedure Notes (Signed)
Procedure Name: MAC Date/Time: 01/10/2022 8:50 AM  Performed by: Glory Buff, CRNAOxygen Delivery Method: Simple face mask

## 2022-01-10 NOTE — Transfer of Care (Signed)
Immediate Anesthesia Transfer of Care Note  Patient: [name]  Procedure(s) Performed: LEFT TOTAL HIP ARTHROPLASTY ANTERIOR APPROACH (Left: Hip)  Patient Location: PACU  Anesthesia Type:Spinal  Level of Consciousness: drowsy, patient cooperative and responds to stimulation  Airway & Oxygen Therapy: Patient Spontanous Breathing and Patient connected to face mask oxygen  Post-op Assessment: Report given to RN and Post -op Vital signs reviewed and stable  Post vital signs: Reviewed and stable  Last Vitals:  Vitals Value Taken Time  BP 101/58 01/10/22 1028  Temp 36.6 C 01/10/22 1025  Pulse 83 01/10/22 1029  Resp 17 01/10/22 1029  SpO2 99 % 01/10/22 1029  Vitals shown include unvalidated device data.  Last Pain:  Vitals:   01/10/22 0616  PainSc: 0-No pain      Patients Stated Pain Goal: 4 (01/25/24 3664)  Complications: No notable events documented.

## 2022-01-10 NOTE — Anesthesia Postprocedure Evaluation (Signed)
Anesthesia Post Note  Patient: [name]  Procedure(s) Performed: LEFT TOTAL HIP ARTHROPLASTY ANTERIOR APPROACH (Left: Hip)     Patient location during evaluation: PACU Anesthesia Type: Spinal Level of consciousness: awake Pain management: pain level controlled Vital Signs Assessment: post-procedure vital signs reviewed and stable Respiratory status: spontaneous breathing, nonlabored ventilation, respiratory function stable and patient connected to nasal cannula oxygen Cardiovascular status: stable and blood pressure returned to baseline Postop Assessment: no apparent nausea or vomiting Anesthetic complications: no   No notable events documented.  Last Vitals:  Vitals:   01/10/22 1155 01/10/22 1350  BP: 116/68 127/67  Pulse: 84 85  Resp: 14 18  Temp: (!) 36.4 C 36.7 C  SpO2: 98% 95%    Last Pain:  Vitals:   01/10/22 1630  TempSrc:   PainSc: 7                  Rayvion Stumph P Lynnelle Mesmer

## 2022-01-10 NOTE — Anesthesia Procedure Notes (Signed)
Spinal  Patient location during procedure: OR Start time: 01/10/2022 8:55 AM End time: 01/10/2022 9:00 AM Reason for block: surgical anesthesia Staffing Performed: anesthesiologist  Anesthesiologist: Murvin Natal, MD Performed by: Murvin Natal, MD Authorized by: Murvin Natal, MD   Preanesthetic Checklist Completed: patient identified, IV checked, risks and benefits discussed, surgical consent, monitors and equipment checked, pre-op evaluation and timeout performed Spinal Block Patient position: sitting Prep: DuraPrep Patient monitoring: cardiac monitor, continuous pulse ox and blood pressure Approach: midline Location: L4-5 Injection technique: single-shot Needle Needle type: Pencan  Needle gauge: 24 G Needle length: 9 cm Assessment Sensory level: T10 Events: CSF return Additional Notes Functioning IV was confirmed and monitors were applied. Sterile prep and drape, including hand hygiene and sterile gloves were used. The patient was positioned and the spine was prepped. The skin was anesthetized with lidocaine.  Free flow of clear CSF was obtained prior to injecting local anesthetic into the CSF.  The spinal needle aspirated freely following injection.  The needle was carefully withdrawn.  The patient tolerated the procedure well.

## 2022-01-10 NOTE — Evaluation (Signed)
Physical Therapy Evaluation Patient Details Name: [name] MRN: [id number] DOB: 12/14/1965 Today's Date: 01/10/2022  History of Present Illness  Pt s/p L THR and with hx of R THR (4/23)  Clinical Impression  Pt s/p L THR and presents with decreased L LE strength/ROM and post op pain limiting functional mobility.  Pt should progress well to dc home with PRN assist of family/friends.     Recommendations for follow up therapy are one component of a multi-disciplinary discharge planning process, led by the attending physician.  Recommendations may be updated based on patient status, additional functional criteria and insurance authorization.  Follow Up Recommendations Follow physician's recommendations for discharge plan and follow up therapies      Assistance Recommended at Discharge PRN  Patient can return home with the following  A little help with walking and/or transfers;A little help with bathing/dressing/bathroom;Assistance with cooking/housework;Assist for transportation;Help with stairs or ramp for entrance    Equipment Recommendations None recommended by PT  Recommendations for Other Services       Functional Status Assessment Patient has had a recent decline in their functional status and demonstrates the ability to make significant improvements in function in a reasonable and predictable amount of time.     Precautions / Restrictions Precautions Precautions: Fall Restrictions Weight Bearing Restrictions: No Other Position/Activity Restrictions: WBAT      Mobility  Bed Mobility Overal bed mobility: Needs Assistance Bed Mobility: Supine to Sit     Supine to sit: Min assist     General bed mobility comments: cues for sequence and use of R LE to self assist    Transfers Overall transfer level: Needs assistance Equipment used: Rolling walker (2 wheels) Transfers: Sit to/from Stand Sit to Stand: Min assist           General transfer comment: cues  for LE management and use of UEs to self assist    Ambulation/Gait Ambulation/Gait assistance: Min assist, Min guard Gait Distance (Feet): 80 Feet Assistive device: Rolling walker (2 wheels) Gait Pattern/deviations: Step-to pattern, Decreased step length - right, Decreased step length - left, Shuffle, Trunk flexed Gait velocity: decr     General Gait Details: cues for sequence, posture and position from [employer]            Wheelchair Mobility    Modified Rankin (Stroke Patients Only)       Balance Overall balance assessment: Needs assistance Sitting-balance support: No upper extremity supported, Feet supported Sitting balance-Leahy Scale: Good     Standing balance support: Single extremity supported Standing balance-Leahy Scale: Fair                               Pertinent Vitals/Pain Pain Assessment Pain Assessment: 0-10 Pain Score: 6  Pain Location: L hip Pain Descriptors / Indicators: Aching, Sore Pain Intervention(s): Limited activity within patient's tolerance, Monitored during session, Premedicated before session, Ice applied, Patient requesting pain meds-RN notified    Home Living Family/patient expects to be discharged to:: Private residence Living Arrangements: Alone Available Help at Discharge: Family;Friend(s);Available PRN/intermittently Type of Home: House Home Access: Stairs to enter Entrance Stairs-Rails: Right Entrance Stairs-Number of Steps: 3+1   Home Layout: One level Home Equipment: Conservation officer, nature (2 wheels);Toilet riser;Cane - single point      Prior Function Prior Level of Function : Independent/Modified Independent  Hand Dominance        Extremity/Trunk Assessment   Upper Extremity Assessment Upper Extremity Assessment: Overall WFL for tasks assessed    Lower Extremity Assessment Lower Extremity Assessment: LLE deficits/detail    Cervical / Trunk Assessment Cervical / Trunk  Assessment: Normal  Communication   Communication: No difficulties  Cognition Arousal/Alertness: Awake/alert Behavior During Therapy: WFL for tasks assessed/performed Overall Cognitive Status: Within Functional Limits for tasks assessed                                          General Comments      Exercises Total Joint Exercises Ankle Circles/Pumps: AAROM, 15 reps, Supine, Both   Assessment/Plan    PT Assessment Patient needs continued PT services  PT Problem List Decreased strength;Decreased range of motion;Decreased activity tolerance;Decreased mobility;Decreased knowledge of use of DME;Pain       PT Treatment Interventions DME instruction;Gait training;Stair training;Functional mobility training;Therapeutic activities;Therapeutic exercise;Patient/family education    PT Goals (Current goals can be found in the Care Plan section)  Acute Rehab PT Goals Patient Stated Goal: REgain IND PT Goal Formulation: With patient Time For Goal Achievement: 01/17/22 Potential to Achieve Goals: Good    Frequency 7X/week     Co-evaluation               AM-PAC PT "6 Clicks" Mobility  Outcome Measure Help needed turning from your back to your side while in a flat bed without using bedrails?: A Little Help needed moving from lying on your back to sitting on the side of a flat bed without using bedrails?: A Little Help needed moving to and from a bed to a chair (including a wheelchair)?: A Little Help needed standing up from a chair using your arms (e.g., wheelchair or bedside chair)?: A Little Help needed to walk in hospital room?: A Little Help needed climbing 3-5 steps with a railing? : A Little 6 Click Score: 18    End of Session Equipment Utilized During Treatment: Gait belt Activity Tolerance: Patient tolerated treatment well Patient left: in chair;with call bell/phone within reach;with chair alarm set;with nursing/sitter in room Nurse Communication:  Mobility status PT Visit Diagnosis: Difficulty in walking, not elsewhere classified (R26.2);Pain Pain - Right/Left: Left Pain - part of body: Hip    Time: 8115-7262 PT Time Calculation (min) (ACUTE ONLY): 28 min   Charges:   PT Evaluation $PT Eval Low Complexity: 1 Low PT Treatments $Gait Training: 8-22 mins        Debe Coder PT Acute Rehabilitation Services Pager [phone] Office [phone]   Anas Reister 01/10/2022, 5:47 PM

## 2022-01-11 DIAGNOSIS — M1612 Unilateral primary osteoarthritis, left hip: Secondary | ICD-10-CM | POA: Diagnosis not present

## 2022-01-11 LAB — CBC
HCT: 34.8 % — ABNORMAL LOW (ref 36.0–46.0)
Hemoglobin: 11.3 g/dL — ABNORMAL LOW (ref 12.0–15.0)
MCH: 30.3 pg (ref 26.0–34.0)
MCHC: 32.5 g/dL (ref 30.0–36.0)
MCV: 93.3 fL (ref 80.0–100.0)
Platelets: 205 10*3/uL (ref 150–400)
RBC: 3.73 MIL/uL — ABNORMAL LOW (ref 3.87–5.11)
RDW: 13.2 % (ref 11.5–15.5)
WBC: 9.5 10*3/uL (ref 4.0–10.5)
nRBC: 0 % (ref 0.0–0.2)

## 2022-01-11 LAB — BASIC METABOLIC PANEL
Anion gap: 6 (ref 5–15)
BUN: 14 mg/dL (ref 6–20)
CO2: 25 mmol/L (ref 22–32)
Calcium: 8.7 mg/dL — ABNORMAL LOW (ref 8.9–10.3)
Chloride: 109 mmol/L (ref 98–111)
Creatinine, Ser: 0.49 mg/dL (ref 0.44–1.00)
GFR, Estimated: >60 mL/min (ref >60)
Glucose, Bld: 138 mg/dL — ABNORMAL HIGH (ref 70–99)
Potassium: 3.9 mmol/L (ref 3.5–5.1)
Sodium: 140 mmol/L (ref 135–145)

## 2022-01-11 MED ORDER — SODIUM CHLORIDE 0.9 % IV BOLUS
500.0000 mL | Freq: Once | INTRAVENOUS | Status: AC
  Administered 2022-01-11: 500 mL via INTRAVENOUS

## 2022-01-11 MED ORDER — ASPIRIN 81 MG PO CHEW: 81.0000 mg | CHEWABLE_TABLET | Freq: Two times a day (BID) | ORAL | 0 refills | Status: DC

## 2022-01-11 MED ORDER — MELOXICAM 15 MG PO TABS: 15.0000 mg | ORAL_TABLET | Freq: Every day | ORAL | 3 refills | Status: DC | PRN

## 2022-01-11 MED ORDER — OXYCODONE HCL 5 MG PO TABS: 5.0000 mg | ORAL_TABLET | ORAL | 0 refills | Status: DC | PRN

## 2022-01-11 MED ORDER — METHOCARBAMOL 500 MG PO TABS: 500.0000 mg | ORAL_TABLET | Freq: Four times a day (QID) | ORAL | 1 refills | Status: DC | PRN

## 2022-01-11 NOTE — Discharge Instructions (Signed)

## 2022-01-11 NOTE — Progress Notes (Signed)
Physical Therapy Treatment Patient Details Name: [name] MRN: [id number] DOB: 1965/09/23 Today's Date: 01/11/2022   History of Present Illness Pt s/p L THR and with hx of R THR (4/23)    PT Comments    Pt continues very cooperative and progressing well with mobility including ambulation increased distance, negotiating stairs and initiating HEP.  However, during ambulation, pt c/o feeling clammy and returned to sitting - BP 86/46 - RN aware.   Recommendations for follow up therapy are one component of a multi-disciplinary discharge planning process, led by the attending physician.  Recommendations may be updated based on patient status, additional functional criteria and insurance authorization.  Follow Up Recommendations  Follow physician's recommendations for discharge plan and follow up therapies     Assistance Recommended at Discharge PRN  Patient can return home with the following A little help with walking and/or transfers;A little help with bathing/dressing/bathroom;Assistance with cooking/housework;Assist for transportation;Help with stairs or ramp for entrance   Equipment Recommendations  None recommended by PT    Recommendations for Other Services       Precautions / Restrictions Precautions Precautions: Fall Restrictions Weight Bearing Restrictions: No LLE Weight Bearing: Weight bearing as tolerated Other Position/Activity Restrictions: WBAT     Mobility  Bed Mobility Overal bed mobility: Needs Assistance Bed Mobility: Supine to Sit     Supine to sit: Min guard     General bed mobility comments: cues for sequence and use of R LE to self assist    Transfers Overall transfer level: Needs assistance Equipment used: Rolling walker (2 wheels) Transfers: Sit to/from Stand Sit to Stand: Min guard           General transfer comment: cues for LE management and use of UEs to self assist    Ambulation/Gait Ambulation/Gait assistance: Min  guard Gait Distance (Feet): 85 Feet Assistive device: Rolling walker (2 wheels) Gait Pattern/deviations: Step-to pattern, Decreased step length - right, Decreased step length - left, Shuffle, Trunk flexed Gait velocity: decr     General Gait Details: cues for sequence, posture and position from RW   Stairs Stairs: Yes Stairs assistance: Min assist Stair Management: One rail Right, Step to pattern, Forwards, With cane Number of Stairs: 5 General stair comments: cues for seqeunce and foot/cane placement   Wheelchair Mobility    Modified Rankin (Stroke Patients Only)       Balance Overall balance assessment: Needs assistance Sitting-balance support: No upper extremity supported, Feet supported Sitting balance-Leahy Scale: Good     Standing balance support: Single extremity supported Standing balance-Leahy Scale: Fair                              Cognition Arousal/Alertness: Awake/alert Behavior During Therapy: WFL for tasks assessed/performed Overall Cognitive Status: Within Functional Limits for tasks assessed                                          Exercises Total Joint Exercises Ankle Circles/Pumps: AAROM, 15 reps, Supine, Both Quad Sets: AROM, Both, 10 reps, Supine Heel Slides: AAROM, Left, 20 reps, Supine Hip ABduction/ADduction: AAROM, Left, 15 reps, Supine    General Comments        Pertinent Vitals/Pain Pain Assessment Pain Assessment: 0-10 Pain Score: 5  Pain Location: L hip Pain Descriptors / Indicators: Aching, Sore Pain Intervention(s): Limited activity  within patient's tolerance, Monitored during session, Premedicated before session, Ice applied    Home Living                          Prior Function            PT Goals (current goals can now be found in the care plan section) Acute Rehab PT Goals Patient Stated Goal: REgain IND PT Goal Formulation: With patient Time For Goal Achievement:  01/17/22 Potential to Achieve Goals: Good Progress towards PT goals: Progressing toward goals    Frequency    7X/week      PT Plan Current plan remains appropriate    Co-evaluation              AM-PAC PT "6 Clicks" Mobility   Outcome Measure  Help needed turning from your back to your side while in a flat bed without using bedrails?: A Little Help needed moving from lying on your back to sitting on the side of a flat bed without using bedrails?: A Little Help needed moving to and from a bed to a chair (including a wheelchair)?: A Little Help needed standing up from a chair using your arms (e.g., wheelchair or bedside chair)?: A Little Help needed to walk in hospital room?: A Little Help needed climbing 3-5 steps with a railing? : A Little 6 Click Score: 18    End of Session Equipment Utilized During Treatment: Gait belt Activity Tolerance: Other (comment) (orthostatic) Patient left: in chair;with call bell/phone within reach;with chair alarm set;with nursing/sitter in room Nurse Communication: Mobility status PT Visit Diagnosis: Difficulty in walking, not elsewhere classified (R26.2);Pain Pain - Right/Left: Left Pain - part of body: Hip     Time: 0850-0930 PT Time Calculation (min) (ACUTE ONLY): 40 min  Charges:  $Gait Training: 8-22 mins $Therapeutic Exercise: 8-22 mins $Therapeutic Activity: 8-22 mins                     Debe Coder PT Acute Rehabilitation Services Pager [phone] Office [phone]    Ashaun Gaughan 01/11/2022, 12:25 PM

## 2022-01-11 NOTE — Progress Notes (Signed)
Subjective: 1 Day Post-Op Procedure(s) (LRB): LEFT TOTAL HIP ARTHROPLASTY ANTERIOR APPROACH (Left) Patient reports pain as moderate.  Did feel a little flushed with morning PT session.  Objective: Vital signs in last 24 hours: Temp:  [97.5 F (36.4 C)-98.4 F (36.9 C)] 97.7 F (36.5 C) (10/14 0520) Pulse Rate:  [69-90] 90 (10/14 0520) Resp:  [11-18] 16 (10/14 0520) BP: (99-127)/(59-69) 99/59 (10/14 0935) SpO2:  [95 %-99 %] 99 % (10/14 0520)  Intake/Output from previous day: 10/13 0701 - 10/14 0700 In: 7867 [P.O.:720; I.V.:2374; IV Piggyback:200] Out: 5449 [EEFEO:7121; Blood:200] Intake/Output this shift: Total I/O In: 240 [P.O.:240] Out: -   Recent Labs    01/11/22 0336  HGB 11.3*   Recent Labs    01/11/22 0336  WBC 9.5  RBC 3.73*  HCT 34.8*  PLT 205   Recent Labs    01/11/22 0336  NA 140  K 3.9  CL 109  CO2 25  BUN 14  CREATININE 0.49  GLUCOSE 138*  CALCIUM 8.7*   No results for input(s): "LABPT", "INR" in the last 72 hours.  Sensation intact distally Intact pulses distally Dorsiflexion/Plantar flexion intact Incision: scant drainage   Assessment/Plan: 1 Day Post-Op Procedure(s) (LRB): LEFT TOTAL HIP ARTHROPLASTY ANTERIOR APPROACH (Left) Up with therapy Discharge home with home health this afternoon if doing well.      Mcarthur Rossetti 01/11/2022, 10:37 AM

## 2022-01-11 NOTE — TOC Transition Note (Signed)
Transition of Care [facility]) - CM/SW Discharge Note   Patient Details  Name: [name] MRN: [id number] Date of Birth: November 08, 1965  [facility] CM/SW Contact:  Lennart Pall, LCSW Phone Number: 01/11/2022, 10:36 AM   Clinical Narrative:    Met with pt and confirming she has needed DME at home.  HHPT prearrnaged with [facility].  No TOC needs.   Final next level of care: [place] Barriers to Discharge: No Barriers Identified   Patient Goals and CMS Choice Patient states their goals for this hospitalization and ongoing recovery are:: return home      Discharge Placement                       Discharge Plan and Services                DME Arranged: N/A DME Agency: NA       HH Arranged: PT [place] Agency: [place]        Social Determinants of Health (SDOH) Interventions     Readmission Risk Interventions     No data to display

## 2022-01-11 NOTE — Progress Notes (Signed)
Physical Therapy Treatment Patient Details Name: [name] MRN: [id number] DOB: 02/23/1966 Today's Date: 01/11/2022   History of Present Illness Pt s/p L THR and with hx of R THR (4/23)    PT Comments    Pt continues motivated and progressing well this pm.  Pt up to ambulate increased distance in hall, negotiated curb, reviewed HEP with written instruction provided, reviewed bed mobility and reviewed car transfers.  No c/o dizziness.  Pt eager for dc home this date.   Recommendations for follow up therapy are one component of a multi-disciplinary discharge planning process, led by the attending physician.  Recommendations may be updated based on patient status, additional functional criteria and insurance authorization.  Follow Up Recommendations  Follow physician's recommendations for discharge plan and follow up therapies     Assistance Recommended at Discharge PRN  Patient can return home with the following A little help with walking and/or transfers;A little help with bathing/dressing/bathroom;Assistance with cooking/housework;Assist for transportation;Help with stairs or ramp for entrance   Equipment Recommendations  None recommended by PT    Recommendations for Other Services       Precautions / Restrictions Precautions Precautions: Fall Restrictions Weight Bearing Restrictions: No LLE Weight Bearing: Weight bearing as tolerated Other Position/Activity Restrictions: WBAT     Mobility  Bed Mobility Overal bed mobility: Needs Assistance Bed Mobility: Sit to Supine     Supine to sit: Min guard Sit to supine: Supervision   General bed mobility comments: Pt self assisting L LE with gait belt    Transfers Overall transfer level: Needs assistance Equipment used: Rolling walker (2 wheels) Transfers: Sit to/from Stand Sit to Stand: Min guard           General transfer comment: cues for LE management and use of UEs to self assist     Ambulation/Gait Ambulation/Gait assistance: Min guard, Supervision Gait Distance (Feet): 100 Feet Assistive device: Rolling walker (2 wheels) Gait Pattern/deviations: Step-to pattern, Decreased step length - right, Decreased step length - left, Shuffle, Trunk flexed Gait velocity: decr     General Gait Details: cues for sequence, posture and position from RW   Stairs Stairs: Yes Stairs assistance: Min guard Stair Management: No rails, Step to pattern, Forwards, With walker Number of Stairs: 2 General stair comments: single step twice fwd with RW   Wheelchair Mobility    Modified Rankin (Stroke Patients Only)       Balance Overall balance assessment: Needs assistance Sitting-balance support: No upper extremity supported, Feet supported Sitting balance-Leahy Scale: Good     Standing balance support: Single extremity supported Standing balance-Leahy Scale: Fair                              Cognition Arousal/Alertness: Awake/alert Behavior During Therapy: WFL for tasks assessed/performed Overall Cognitive Status: Within Functional Limits for tasks assessed                                          Exercises Total Joint Exercises Ankle Circles/Pumps: AAROM, 15 reps, Supine, Both Quad Sets: AROM, Both, 10 reps, Supine Heel Slides: AAROM, Left, 20 reps, Supine Hip ABduction/ADduction: AAROM, Left, 15 reps, Supine    General Comments        Pertinent Vitals/Pain Pain Assessment Pain Assessment: 0-10 Pain Score: 5  Pain Location: L hip Pain Descriptors /  Indicators: Aching, Sore Pain Intervention(s): Limited activity within patient's tolerance, Monitored during session, Premedicated before session, Ice applied    Home Living                          Prior Function            PT Goals (current goals can now be found in the care plan section) Acute Rehab PT Goals Patient Stated Goal: REgain IND PT Goal Formulation:  With patient Time For Goal Achievement: 01/17/22 Potential to Achieve Goals: Good Progress towards PT goals: Progressing toward goals    Frequency    7X/week      PT Plan Current plan remains appropriate    Co-evaluation              AM-PAC PT "6 Clicks" Mobility   Outcome Measure  Help needed turning from your back to your side while in a flat bed without using bedrails?: A Little Help needed moving from lying on your back to sitting on the side of a flat bed without using bedrails?: None Help needed moving to and from a bed to a chair (including a wheelchair)?: None Help needed standing up from a chair using your arms (e.g., wheelchair or bedside chair)?: None Help needed to walk in hospital room?: A Little Help needed climbing 3-5 steps with a railing? : A Little 6 Click Score: 21    End of Session Equipment Utilized During Treatment: Gait belt Activity Tolerance: Other (comment) Patient left: with call bell/phone within reach;in bed Nurse Communication: Mobility status PT Visit Diagnosis: Difficulty in walking, not elsewhere classified (R26.2);Pain Pain - Right/Left: Left Pain - part of body: Hip     Time: 1343-1415 PT Time Calculation (min) (ACUTE ONLY): 32 min  Charges:  $Gait Training: 8-22 mins $Therapeutic Activity: 8-22 mins                     [place] Pager [phone] Office [phone]    Reality Dejonge 01/11/2022, 4:34 PM

## 2022-01-11 NOTE — Discharge Summary (Signed)
Patient ID: [name] MRN: [id number] DOB/AGE: 12/05/1965 56 y.o.  Admit date: 01/10/2022 Discharge date: 01/11/2022  Admission Diagnoses:  Principal Problem:   Unilateral primary osteoarthritis, left hip Active Problems:   Status post total replacement of left hip   Discharge Diagnoses:  Same  Past Medical History:  Diagnosis Date   Arthritis    Colon polyps    Endometriosis    Fibroid, uterine    GERD (gastroesophageal reflux disease)    H. pylori infection    HLD (hyperlipidemia)     Surgeries: Procedure(s): LEFT TOTAL HIP ARTHROPLASTY ANTERIOR APPROACH on 01/10/2022   Consultants:   Discharged Condition: Improved  Hospital Course: [name] is an 56 y.o. female who was admitted 01/10/2022 for operative treatment ofUnilateral primary osteoarthritis, left hip. Patient has severe unremitting pain that affects sleep, daily activities, and work/hobbies. After pre-op clearance the patient was taken to the operating room on 01/10/2022 and underwent  Procedure(s): LEFT TOTAL HIP ARTHROPLASTY ANTERIOR APPROACH.    Patient was given perioperative antibiotics:  Anti-infectives (From admission, onward)    Start     Dose/Rate Route Frequency Ordered Stop   01/10/22 1500  ceFAZolin (ANCEF) IVPB 1 g/50 mL premix        1 g 100 mL/hr over 30 Minutes Intravenous Every 6 hours 01/10/22 1200 01/10/22 2108   01/10/22 0600  ceFAZolin (ANCEF) IVPB 2g/100 mL premix        2 g 200 mL/hr over 30 Minutes Intravenous On call to O.R. 01/10/22 0557 01/10/22 0924        Patient was given sequential compression devices, early ambulation, and chemoprophylaxis to prevent DVT.  Patient benefited maximally from hospital stay and there were no complications.    Recent vital signs: Patient Vitals for the past 24 hrs:  BP Temp Temp src Pulse Resp SpO2  01/11/22 1320 139/69 98.1 F (36.7 C) Oral (!) 108 18 100 %  01/11/22 1231 134/68 -- -- 96 -- --  01/11/22 0935 (!) 99/59 -- --  -- -- --  01/11/22 0520 (!) 126/59 97.7 F (36.5 C) Oral 90 16 99 %  01/11/22 0200 118/67 98.4 F (36.9 C) Oral 89 15 97 %  01/10/22 2231 116/69 98.3 F (36.8 C) Oral 88 14 96 %     Recent laboratory studies:  Recent Labs    01/11/22 0336  WBC 9.5  HGB 11.3*  HCT 34.8*  PLT 205  NA 140  K 3.9  CL 109  CO2 25  BUN 14  CREATININE 0.49  GLUCOSE 138*  CALCIUM 8.7*     Discharge Medications:   Allergies as of 01/11/2022   No Known Allergies      Medication List     TAKE these medications    acetaminophen 500 MG tablet Commonly known as: TYLENOL Take 1,000 mg by mouth 3 (three) times daily as needed for moderate pain.   aspirin 81 MG chewable tablet Chew 1 tablet (81 mg total) by mouth 2 (two) times daily.   lovastatin 20 MG tablet Commonly known as: MEVACOR Take 20 mg by mouth every evening.   meloxicam 15 MG tablet Commonly known as: MOBIC Take 1 tablet (15 mg total) by mouth daily as needed for pain. What changed:  when to take this reasons to take this   methocarbamol 500 MG tablet Commonly known as: ROBAXIN Take 1 tablet (500 mg total) by mouth every 6 (six) hours as needed for muscle spasms.   multivitamin tablet Take  1 tablet by mouth daily.   OVER THE COUNTER MEDICATION Take 1 capsule by mouth daily. Circulation and Vein Support   OVER THE COUNTER MEDICATION Take 1 capsule by mouth daily. Complete Joint Effort   OVER THE COUNTER MEDICATION Take 1 tablet by mouth daily. [facility] supplement   oxyCODONE 5 MG immediate release tablet Commonly known as: Oxy IR/ROXICODONE Take 1-2 tablets (5-10 mg total) by mouth every 4 (four) hours as needed for moderate pain (pain score 4-6). What changed:  how much to take when to take this reasons to take this   [place] Take 1 tablet by mouth daily.   PSYLLIUM PO Take 1 capsule by mouth daily.   zinc gluconate 50 MG tablet Take 50 mg by mouth daily.                Durable Medical Equipment  (From admission, onward)           Start     Ordered   01/10/22 1201  DME 3 n 1  Once        01/10/22 1200   01/10/22 1201  DME Walker rolling  Once       Question Answer Comment  Walker: With 5 Inch Wheels   Patient needs a walker to treat with the following condition Status post total hip replacement, left      01/10/22 1200            Diagnostic Studies: DG Pelvis Portable  Result Date: 01/10/2022 CLINICAL DATA:  Status post total left hip arthroplasty. EXAM: PORTABLE PELVIS 1-2 VIEWS COMPARISON:  AP pelvis 11/27/2021 FINDINGS: Redemonstration of total right hip arthroplasty. New total left hip arthroplasty. No perihardware lucency is seen to indicate hardware failure or loosening. Expected postoperative changes including lateral left buttock and hip soft tissue swelling and subcutaneous air. Lateral left hip surgical skin staples. No acute fracture or dislocation. IMPRESSION: Interval total left hip arthroplasty without evidence of hardware failure. Electronically Signed   By: Yvonne Kendall M.D.   On: 01/10/2022 10:42   DG HIP UNILAT WITH PELVIS 1V LEFT  Result Date: 01/10/2022 CLINICAL DATA:  Anterior total left hip arthroplasty. EXAM: DG HIP (WITH OR WITHOUT PELVIS) 1V*L* COMPARISON:  Bilateral hip radiographs 04/24/2021 and AP pelvis 11/27/2021 FINDINGS: Images were performed intraoperatively without the presence of a radiologist. Redemonstration of prior total right hip arthroplasty. Severe left femoroacetabular osteoarthritis. New total left hip arthroplasty. No hardware complication is seen. Total fluoroscopy images: 6 Total fluoroscopy time: 12 seconds Total dose: Radiation Exposure Index (as provided by the fluoroscopic device): 1.8 mGy air Kerma Please see intraoperative findings for further detail. IMPRESSION: New total left hip arthroplasty. No hardware complication is seen. Electronically Signed   By: Yvonne Kendall M.D.   On: 01/10/2022  10:36   DG C-Arm 1-60 Min-No Report  Result Date: 01/10/2022 Fluoroscopy was utilized by the requesting physician.  No radiographic interpretation.    Disposition: Discharge disposition: 01-Home or [place], [place] Follow up.   Specialty: [place] Why: to provide home physical therapy visits Contact information: [address] [place] [address] [phone]                  Signed: Mcarthur Rossetti 01/11/2022, 6:15 PM

## 2022-01-11 NOTE — Progress Notes (Signed)
NS Bolus complete. BP stable.

## 2022-01-13 ENCOUNTER — Encounter (HOSPITAL_COMMUNITY): Payer: Self-pay | Admitting: Orthopaedic Surgery

## 2022-01-23 ENCOUNTER — Ambulatory Visit (INDEPENDENT_AMBULATORY_CARE_PROVIDER_SITE_OTHER): Payer: Commercial Managed Care - PPO | Admitting: Orthopaedic Surgery

## 2022-01-23 ENCOUNTER — Encounter: Payer: Self-pay | Admitting: Orthopaedic Surgery

## 2022-01-23 DIAGNOSIS — Z96642 Presence of left artificial hip joint: Secondary | ICD-10-CM

## 2022-01-23 MED ORDER — OXYCODONE HCL 5 MG PO TABS: 5.0000 mg | ORAL_TABLET | Freq: Four times a day (QID) | ORAL | 0 refills | Status: AC | PRN

## 2022-01-23 NOTE — Progress Notes (Signed)
The patient is here today for first postoperative visit status post a left total hip arthroplasty.  We replaced her right hip in April of this year.  She is only 56 years old and very active.  She unfortunately had significant arthritic changes in both hips.  She is doing well overall.  She has been compliant with a baby aspirin twice daily.  There is no foot and ankle swelling so she can stop her aspirin.  Her left hip incision looks good.  I removed the staples and place Steri-Strips.  There is no significant seroma.  When she lay supine her leg lengths are equal.  She will continue to increase her activities as comfort allows.  I will send in some more pain medication to use as needed.  She can resume working from home starting next week and then has the option to return to full work duties without restrictions the following week if she can.  From my standpoint, I will see her back in 4 weeks to see how she is doing overall but no x-rays are needed.

## 2022-02-24 ENCOUNTER — Ambulatory Visit: Payer: Commercial Managed Care - PPO | Admitting: Orthopaedic Surgery

## 2022-06-05 ENCOUNTER — Encounter: Payer: Self-pay | Admitting: Radiology

## 2022-08-24 ENCOUNTER — Ambulatory Visit
Admission: EM | Admit: 2022-08-24 | Discharge: 2022-08-24 | Disposition: A | Discharge status: Home / Self Care | Payer: Commercial Managed Care - PPO | Attending: Urgent Care | Admitting: Urgent Care

## 2022-08-24 ENCOUNTER — Ambulatory Visit (INDEPENDENT_AMBULATORY_CARE_PROVIDER_SITE_OTHER): Payer: Commercial Managed Care - PPO

## 2022-08-24 ENCOUNTER — Other Ambulatory Visit: Payer: Self-pay

## 2022-08-24 ENCOUNTER — Encounter: Payer: Self-pay | Admitting: Emergency Medicine

## 2022-08-24 DIAGNOSIS — J189 Pneumonia, unspecified organism: Secondary | ICD-10-CM

## 2022-08-24 MED ORDER — AMOXICILLIN-POT CLAVULANATE 875-125 MG PO TABS: 1.0000 | ORAL_TABLET | Freq: Two times a day (BID) | ORAL | 0 refills | Status: AC

## 2022-08-24 MED ORDER — AZITHROMYCIN 250 MG PO TABS: 250.0000 mg | ORAL_TABLET | Freq: Every day | ORAL | 0 refills | Status: AC

## 2022-08-24 NOTE — ED Triage Notes (Signed)
Pt here for cough and congestion x 6 days 

## 2022-08-24 NOTE — Discharge Instructions (Addendum)
We are still waiting on the x-ray results, however based on your symptoms and vital signs, I feel it is important to cover for you for acute community acquired pneumonia.  Please take Augmentin twice daily with food. Please take azithromycin per package instructions. You may take plain Mucinex twice daily, this is an expectorant that helps you bring up the mucus. Drink plenty of water. If your symptoms persist, or if you continue to develop chest discomfort or rapid heart rate, please follow-up with your primary or head to the emergency room.

## 2022-08-24 NOTE — ED Provider Notes (Signed)
[facility]    CSN: [id number] Arrival date & time: 08/24/22  0815      History   Chief Complaint Chief Complaint  Patient presents with   Cough    HPI [name] is a 57 y.o. female.   Pleasant 57 year old female presents due to concerns of upper respiratory symptoms.  She states the past 6 days she has had a significant cough.  She reports some discomfort while coughing, describes it as a burning substernal chest pain.  She also reports some pain to her bilateral lung bases.  States that numerous coworkers have been sick recently.  She denies a fever but states she has been "hot".  She does not smoke.  She does not have any chronic pulmonary issues.  She is been taking numerous over-the-counter medications including Mucinex and cough suppressants without any significant improvement to her symptoms.  She reports a scratchy throat and congestion.   Cough   Past Medical History:  Diagnosis Date   Arthritis    Colon polyps    Endometriosis    Fibroid, uterine    GERD (gastroesophageal reflux disease)    H. pylori infection    HLD (hyperlipidemia)     Patient Active Problem List   Diagnosis Date Noted   Status post total replacement of left hip 01/10/2022   Status post total replacement of right hip 07/19/2021   Hx of colonic polyp 07/14/2017   Hx of endometriosis 07/14/2017    Past Surgical History:  Procedure Laterality Date   ABDOMINAL HYSTERECTOMY     LAPAROSCOPY     x 2   TONSILLECTOMY     TOTAL HIP ARTHROPLASTY Right 07/19/2021   Procedure: RIGHT TOTAL HIP ARTHROPLASTY ANTERIOR APPROACH;  Surgeon: Kathryne Hitch, MD;  Location: [facility];  Service: Orthopedics;  Laterality: Right;   TOTAL HIP ARTHROPLASTY Left 01/10/2022   Procedure: LEFT TOTAL HIP ARTHROPLASTY ANTERIOR APPROACH;  Surgeon: Kathryne Hitch, MD;  Location: [facility];  Service: Orthopedics;  Laterality: Left;    OB History   No obstetric history on file.       Home Medications    Prior to Admission medications   Medication Sig Start Date End Date Taking? Authorizing Provider  amoxicillin-clavulanate (AUGMENTIN) 875-125 MG tablet Take 1 tablet by mouth 2 (two) times daily with a meal for 5 days. 08/24/22 08/29/22 Yes Taran Hable L, PA  azithromycin (ZITHROMAX) 250 MG tablet Take 1 tablet (250 mg total) by mouth daily. Take first 2 tablets together, then 1 every day until finished. 08/24/22  Yes Garret Teale L, PA  acetaminophen (TYLENOL) 500 MG tablet Take 1,000 mg by mouth 3 (three) times daily as needed for moderate pain.    [provider]  aspirin 81 MG chewable tablet Chew 1 tablet (81 mg total) by mouth 2 (two) times daily. 01/11/22   Kathryne Hitch, MD  lovastatin (MEVACOR) 20 MG tablet Take 20 mg by mouth every evening. 04/28/17   [provider]  meloxicam (MOBIC) 15 MG tablet Take 1 tablet (15 mg total) by mouth daily as needed for pain. 01/11/22   Kathryne Hitch, MD  methocarbamol (ROBAXIN) 500 MG tablet Take 1 tablet (500 mg total) by mouth every 6 (six) hours as needed for muscle spasms. 01/11/22   Kathryne Hitch, MD  Multiple Vitamin (MULTIVITAMIN) tablet Take 1 tablet by mouth daily.    [provider]  OVER THE COUNTER MEDICATION Take 1 capsule by mouth daily. Circulation  and Vein Support    [provider]  OVER THE COUNTER MEDICATION Take 1 capsule by mouth daily. Complete Joint Effort    [provider]  OVER THE COUNTER MEDICATION Take 1 tablet by mouth daily. Cholestacare supplement    [provider]  oxyCODONE (OXY IR/ROXICODONE) 5 MG immediate release tablet Take 1-2 tablets (5-10 mg total) by mouth every 6 (six) hours as needed for moderate pain (pain score 4-6). 01/23/22   Kathryne Hitch, MD  Probiotic Product (PHILLIPS COLON HEALTH PO) Take 1 tablet by mouth daily.    [provider]  PSYLLIUM PO Take 1 capsule by mouth  daily.    [provider]  zinc gluconate 50 MG tablet Take 50 mg by mouth daily.    [provider]    Family History Family History  Problem Relation Age of Onset   Clotting disorder Mother    Clotting disorder Brother    Colon cancer Paternal Aunt    Heart disease Paternal Uncle    Heart disease Maternal Uncle     Social History Social History   Tobacco Use   Smoking status: Former    Types: Cigarettes    Quit date: 2005    Years since quitting: 19.4   Smokeless tobacco: Never  Vaping Use   Vaping Use: Never used  Substance Use Topics   Alcohol use: Yes    Comment: daily beer   Drug use: Never     Allergies   Patient has no known allergies.   Review of Systems Review of Systems  Respiratory:  Positive for cough.   As per HPI   Physical Exam Triage Vital Signs ED Triage Vitals [08/24/22 0837]  Enc Vitals Group     BP 131/89     Pulse Rate (!) 106     Resp 18     Temp 98.1 F (36.7 C)     Temp Source Oral     SpO2 95 %     Weight      Height      Head Circumference      Peak Flow      Pain Score 4     Pain Loc      Pain Edu?      Excl. in GC?    No data found.  Updated Vital Signs BP 131/89 (BP Location: Left Arm)   Pulse (!) 106   Temp 98.1 F (36.7 C) (Oral)   Resp 18   LMP 05/28/2012   SpO2 95%   Visual Acuity Right Eye Distance:   Left Eye Distance:   Bilateral Distance:    Right Eye Near:   Left Eye Near:    Bilateral Near:     Physical Exam Vitals and nursing note reviewed.  Constitutional:      General: She is not in acute distress.    Appearance: Normal appearance. She is well-developed. She is obese. She is not toxic-appearing or diaphoretic.  HENT:     Head: Normocephalic and atraumatic.     Right Ear: Tympanic membrane, ear canal and external ear normal. There is no impacted cerumen.     Left Ear: Tympanic membrane, ear canal and external ear normal. There is no impacted cerumen.     Nose: Nose  normal. No congestion or rhinorrhea.     Mouth/Throat:     Mouth: Mucous membranes are moist.     Pharynx: Oropharynx is clear. No oropharyngeal exudate or posterior oropharyngeal erythema.  Eyes:     General: No scleral icterus.       Right eye: No discharge.        Left eye: No discharge.     Extraocular Movements: Extraocular movements intact.     Conjunctiva/sclera: Conjunctivae normal.     Pupils: Pupils are equal, round, and reactive to light.  Cardiovascular:     Rate and Rhythm: Normal rate and regular rhythm.  Pulmonary:     Effort: Pulmonary effort is normal. No accessory muscle usage, respiratory distress or retractions.     Breath sounds: Normal breath sounds and air entry. No stridor, decreased air movement or transmitted upper airway sounds. No decreased breath sounds, wheezing, rhonchi or rales.     Comments: Harsh crackles to RLL posterior lung fields with increased tactile fremitus Abdominal:     Palpations: Abdomen is soft.     Tenderness: There is no abdominal tenderness.  Musculoskeletal:        General: No swelling.     Cervical back: Normal range of motion and neck supple. No rigidity or tenderness.  Lymphadenopathy:     Cervical: No cervical adenopathy.  Skin:    General: Skin is warm and dry.     Capillary Refill: Capillary refill takes less than 2 seconds.     Coloration: Skin is not jaundiced.     Findings: No bruising, erythema or rash.  Neurological:     General: No focal deficit present.     Mental Status: She is alert and oriented to person, place, and time.  Psychiatric:        Mood and Affect: Mood normal.      UC Treatments / Results  Labs (all labs ordered are listed, but only abnormal results are displayed) Labs Reviewed - No data to display  EKG   Radiology DG Chest 2 View  Result Date: 08/24/2022 CLINICAL DATA:  Cough. Fevers. Low oxygen. Tachycardia. 6 days. Crackles and right upper lobe. Concern for pneumonia. EXAM: CHEST - 2  VIEW COMPARISON:  Chest radiographs 02/19/2015 FINDINGS: Cardiac silhouette and mediastinal contours are within normal limits. No focal airspace opacity to indicate pneumonia. There are two nodular densities overlying the inferolateral right lung on frontal view, each measuring up to approximately 6-7 mm. The more superior density overlies the anterolateral right sixth rib. These are not definitely visualized on prior remote 02/19/2015 radiograph. Otherwise, the lungs are clear. No pleural effusion or pneumothorax. Mild-to-moderate multilevel disc space narrowing endplate sclerosis of the mid to upper thoracic spine. IMPRESSION: 1. No acute cardiopulmonary process. 2. Two nodular densities overlying the inferolateral right lung on frontal view, each measuring up to 6-7 mm. The more superior density overlies the anterolateral right sixth rib. These are not definitely visualized on prior remote 02/19/2015 radiograph. These may represent a tiny infectious/inflammatory process (which would be transitory) or new nodules. Consider follow-up radiographs after the patient's symptoms resolve (such as in 4-6 weeks) to assess for whether these densities persist. Electronically Signed   By: Neita Garnet M.D.   On: 08/24/2022 09:51    Procedures Procedures (including critical care time)  Medications Ordered in UC Medications - No data to display  Initial Impression / Assessment and Plan / UC Course  I have reviewed the triage vital signs and the nursing notes.  Pertinent labs & imaging results that were available during my care of the patient were reviewed by me and considered in my medical decision making (see chart for details).  CAP -patient's symptoms are most consistent with a community-acquired pneumonia.  Will start her on dual therapy with Augmentin and azithromycin.  Her oxygen sats hovered between 92 to 95% in our office today.  I did discuss her abnormal chest x-ray and patient understands to  follow-up with her primary care in 4 to 6 weeks to recheck this.  She also understands to head to the emergency room if she develops worsening symptoms, chest pain, fevers.   Final Clinical Impressions(s) / UC Diagnoses   Final diagnoses:  Community acquired pneumonia of right middle lobe of lung     Discharge Instructions      We are still waiting on the x-ray results, however based on your symptoms and vital signs, I feel it is important to cover for you for acute community acquired pneumonia.  Please take Augmentin twice daily with food. Please take azithromycin per package instructions. You may take plain Mucinex twice daily, this is an expectorant that helps you bring up the mucus. Drink plenty of water. If your symptoms persist, or if you continue to develop chest discomfort or rapid heart rate, please follow-up with your primary or head to the emergency room.     ED Prescriptions     Medication Sig Dispense Auth. Provider   amoxicillin-clavulanate (AUGMENTIN) 875-125 MG tablet Take 1 tablet by mouth 2 (two) times daily with a meal for 5 days. 10 tablet Tiondra Fang L, PA   azithromycin (ZITHROMAX) 250 MG tablet Take 1 tablet (250 mg total) by mouth daily. Take first 2 tablets together, then 1 every day until finished. 6 tablet Fionna Merriott L, [place]      PDMP not reviewed this encounter.   Maretta Bees, [place] 08/24/22 1702

## 2022-12-07 ENCOUNTER — Ambulatory Visit
Admission: EM | Admit: 2022-12-07 | Discharge: 2022-12-07 | Disposition: A | Discharge status: Home / Self Care | Payer: Commercial Managed Care - PPO | Attending: Internal Medicine | Admitting: Internal Medicine

## 2022-12-07 DIAGNOSIS — R519 Headache, unspecified: Secondary | ICD-10-CM

## 2022-12-07 DIAGNOSIS — B372 Candidiasis of skin and nail: Secondary | ICD-10-CM | POA: Diagnosis not present

## 2022-12-07 MED ORDER — CLOTRIMAZOLE 1 % EX CREA: TOPICAL_CREAM | CUTANEOUS | 0 refills | Status: AC

## 2022-12-07 MED ORDER — FLUCONAZOLE 150 MG PO TABS: 150.0000 mg | ORAL_TABLET | ORAL | 0 refills | Status: AC

## 2022-12-07 NOTE — Discharge Instructions (Addendum)
I have prescribed you Diflucan pill to take once weekly for 3 weeks as well as a topical cream that you apply twice daily.  Given that you take lovastatin, recommend that you monitor for muscle aches and notify your PCP immediately.

## 2022-12-07 NOTE — ED Triage Notes (Addendum)
Patient presents with rash under breast, in between thighs and under belly role, concerns of yeast and states she has a headache.  Treated with antibiotics and Amish soap

## 2022-12-07 NOTE — ED Provider Notes (Signed)
[facility]    CSN: [id number] Arrival date & time: 12/07/22  0904      History   Chief Complaint Chief Complaint  Patient presents with   Rash    HPI [name] is a 57 y.o. female.   Patient presents with 2 different chief complaints today.  Reports that she has an itchy and painful rash underneath breasts, under abdominal fold, to bilateral inner thighs that has been present for about a month.  States this is the first time she has been evaluated for this.  She has used an Amish soap as well as an antibacterial cream with no improvement.  Denies any associated fever.  Patient also reporting a left-sided headache that started upon awakening this morning. States that she possibly has some minimal nasal congestion as well. Denies any fever or known sick contacts.    Rash   Past Medical History:  Diagnosis Date   Arthritis    Colon polyps    Endometriosis    Fibroid, uterine    GERD (gastroesophageal reflux disease)    H. pylori infection    HLD (hyperlipidemia)     Patient Active Problem List   Diagnosis Date Noted   Status post total replacement of left hip 01/10/2022   Status post total replacement of right hip 07/19/2021   Hx of colonic polyp 07/14/2017   Hx of endometriosis 07/14/2017    Past Surgical History:  Procedure Laterality Date   ABDOMINAL HYSTERECTOMY     LAPAROSCOPY     x 2   TONSILLECTOMY     TOTAL HIP ARTHROPLASTY Right 07/19/2021   Procedure: RIGHT TOTAL HIP ARTHROPLASTY ANTERIOR APPROACH;  Surgeon: Kathryne Hitch, MD;  Location: [facility];  Service: Orthopedics;  Laterality: Right;   TOTAL HIP ARTHROPLASTY Left 01/10/2022   Procedure: LEFT TOTAL HIP ARTHROPLASTY ANTERIOR APPROACH;  Surgeon: Kathryne Hitch, MD;  Location: [facility];  Service: Orthopedics;  Laterality: Left;    OB History   No obstetric history on file.      Home Medications    Prior to Admission medications   Medication Sig Start Date End  Date Taking? Authorizing Provider  clotrimazole (LOTRIMIN) 1 % cream Apply to affected area 2 times daily 12/07/22  Yes Akeel Reffner, [place] E, FNP  fluconazole (DIFLUCAN) 150 MG tablet Take 1 tablet (150 mg total) by mouth once a week. 12/07/22  Yes Felice Deem, Rolly Salter E, FNP  acetaminophen (TYLENOL) 500 MG tablet Take 1,000 mg by mouth 3 (three) times daily as needed for moderate pain.    [provider]  aspirin 81 MG chewable tablet Chew 1 tablet (81 mg total) by mouth 2 (two) times daily. 01/11/22   Kathryne Hitch, MD  azithromycin (ZITHROMAX) 250 MG tablet Take 1 tablet (250 mg total) by mouth daily. Take first 2 tablets together, then 1 every day until finished. 08/24/22   Crain, Whitney L, PA  lovastatin (MEVACOR) 20 MG tablet Take 20 mg by mouth every evening. 04/28/17   [provider]  meloxicam (MOBIC) 15 MG tablet Take 1 tablet (15 mg total) by mouth daily as needed for pain. 01/11/22   Kathryne Hitch, MD  methocarbamol (ROBAXIN) 500 MG tablet Take 1 tablet (500 mg total) by mouth every 6 (six) hours as needed for muscle spasms. 01/11/22   Kathryne Hitch, MD  Multiple Vitamin (MULTIVITAMIN) tablet Take 1 tablet by mouth daily.    [provider]  OVER THE COUNTER MEDICATION Take 1  capsule by mouth daily. Circulation and Vein Support    [provider]  OVER THE COUNTER MEDICATION Take 1 capsule by mouth daily. Complete Joint Effort    [provider]  OVER THE COUNTER MEDICATION Take 1 tablet by mouth daily. Chol[facility] supplement    [provider]  oxyCODONE (OXY IR/ROXICODONE) 5 MG immediate release tablet Take 1-2 tablets (5-10 mg total) by mouth every 6 (six) hours as needed for moderate pain (pain score 4-6). 01/23/22   Kathryne Hitch, MD  Probiotic Product (PHILLIPS COLON HEALTH PO) Take 1 tablet by mouth daily.    [provider]  PSYLLIUM PO Take 1 capsule by mouth daily.    [provider]   zinc gluconate 50 MG tablet Take 50 mg by mouth daily.    [provider]    Family History Family History  Problem Relation Age of Onset   Clotting disorder Mother    Clotting disorder Brother    Colon cancer Paternal Aunt    Heart disease Paternal Uncle    Heart disease Maternal Uncle     Social History Social History   Tobacco Use   Smoking status: Former    Current packs/day: 0.00    Types: Cigarettes    Quit date: 2005    Years since quitting: 19.6   Smokeless tobacco: Never  Vaping Use   Vaping status: Never Used  Substance Use Topics   Alcohol use: Yes    Comment: daily beer   Drug use: Never     Allergies   Patient has no known allergies.   Review of Systems Review of Systems Per HPI  Physical Exam Triage Vital Signs ED Triage Vitals  Encounter Vitals Group     BP 12/07/22 0938 (!) 150/79     Systolic BP Percentile --      Diastolic BP Percentile --      Pulse Rate 12/07/22 0938 79     Resp 12/07/22 0938 17     Temp 12/07/22 0938 97.9 F (36.6 C)     Temp Source 12/07/22 0938 Oral     SpO2 12/07/22 0938 96 %     Weight 12/07/22 0939 200 lb (90.7 kg)     Height 12/07/22 0939 5\' 2"  (1.575 m)     Head Circumference --      Peak Flow --      Pain Score 12/07/22 0939 2     Pain Loc --      Pain Education --      Exclude from Growth Chart --    No data found.  Updated Vital Signs BP (!) 150/79 (BP Location: Left Arm)   Pulse 79   Temp 97.9 F (36.6 C) (Oral)   Resp 17   Ht 5\' 2"  (1.575 m)   Wt 200 lb (90.7 kg)   LMP 05/28/2012   SpO2 96%   BMI 36.58 kg/m   Visual Acuity Right Eye Distance:   Left Eye Distance:   Bilateral Distance:    Right Eye Near:   Left Eye Near:    Bilateral Near:     Physical Exam Exam conducted with a chaperone present.  Constitutional:      General: She is not in acute distress.    Appearance: Normal appearance. She is not toxic-appearing or diaphoretic.  HENT:     Head: Normocephalic and  atraumatic.     Right Ear: Tympanic membrane and ear canal normal.     Left  Ear: Tympanic membrane and ear canal normal.     Nose: Congestion present.     Comments: Possibly minimal nasal congestion.     Mouth/Throat:     Mouth: Mucous membranes are moist.     Pharynx: No posterior oropharyngeal erythema.  Eyes:     Extraocular Movements: Extraocular movements intact.     Conjunctiva/sclera: Conjunctivae normal.     Pupils: Pupils are equal, round, and reactive to light.  Cardiovascular:     Rate and Rhythm: Normal rate and regular rhythm.     Pulses: Normal pulses.     Heart sounds: Normal heart sounds.  Pulmonary:     Effort: Pulmonary effort is normal. No respiratory distress.     Breath sounds: Normal breath sounds. No wheezing.  Abdominal:     General: Abdomen is flat. Bowel sounds are normal.     Palpations: Abdomen is soft.  Musculoskeletal:        General: Normal range of motion.     Cervical back: Normal range of motion.  Skin:    General: Skin is warm and dry.     Comments: Patient has erythematous, satellite like lesions underneath bilateral breasts, underneath the mid abdominal fold, and to bilateral inner thighs/groin area.  Neurological:     General: No focal deficit present.     Mental Status: She is alert and oriented to person, place, and time. Mental status is at baseline.     Cranial Nerves: Cranial nerves 2-12 are intact.     Sensory: Sensation is intact.     Motor: Motor function is intact.     Coordination: Coordination is intact.     Gait: Gait is intact.  Psychiatric:        Mood and Affect: Mood normal.        Behavior: Behavior normal.      UC Treatments / Results  Labs (all labs ordered are listed, but only abnormal results are displayed) Labs Reviewed - No data to display  EKG   Radiology No results found.  Procedures Procedures (including critical care time)  Medications Ordered in UC Medications - No data to display  Initial  Impression / Assessment and Plan / UC Course  I have reviewed the triage vital signs and the nursing notes.  Pertinent labs & imaging results that were available during my care of the patient were reviewed by me and considered in my medical decision making (see chart for details).     1.  Candidal skin infection  It appears that patient's rash is due to candidiasis.  Will treat with Diflucan once weekly for 3 weeks and clotrimazole topically.  Patient does take lovastatin so encouraged her to monitor for increased muscle aches and notify PCP or urgent care if this occurs.  Advised her to follow-up if rash persists or worsens as well.  2.  Acute headache  Suspect this is due to mild nasal congestion and sinus headache.  No concern for sinus infection on exam.  Could be viral versus allergies.  Advised supportive care related to this and following up if symptoms persist or worsen.  Patient verbalized understanding and was agreeable with plan. Final Clinical Impressions(s) / UC Diagnoses   Final diagnoses:  Candidal skin infection  Acute nonintractable headache, unspecified headache type     Discharge Instructions      I have prescribed you Diflucan pill to take once weekly for 3 weeks as well as a topical cream that you apply twice daily.  Given that you take lovastatin, recommend that you monitor for muscle aches and notify your PCP immediately.    ED Prescriptions     Medication Sig Dispense Auth. Provider   clotrimazole (LOTRIMIN) 1 % cream Apply to affected area 2 times daily 85 g Shaquoya Cosper, Rolly Salter E, FNP   fluconazole (DIFLUCAN) 150 MG tablet Take 1 tablet (150 mg total) by mouth once a week. 3 tablet [place], [place] E, [place]      I have reviewed the PDMP during this encounter.   Gustavus Bryant, [place] 12/07/22 1023

## 2023-06-14 ENCOUNTER — Encounter: Payer: Self-pay | Admitting: Emergency Medicine

## 2023-06-14 ENCOUNTER — Other Ambulatory Visit: Payer: Self-pay

## 2023-06-14 ENCOUNTER — Ambulatory Visit: Admission: EM | Admit: 2023-06-14 | Discharge: 2023-06-14 | Disposition: A | Discharge status: Home / Self Care

## 2023-06-14 DIAGNOSIS — R051 Acute cough: Secondary | ICD-10-CM | POA: Diagnosis not present

## 2023-06-14 DIAGNOSIS — J069 Acute upper respiratory infection, unspecified: Secondary | ICD-10-CM

## 2023-06-14 DIAGNOSIS — J111 Influenza due to unidentified influenza virus with other respiratory manifestations: Secondary | ICD-10-CM

## 2023-06-14 LAB — POCT URINALYSIS DIP (MANUAL ENTRY)
Bilirubin, UA: NEGATIVE
Blood, UA: NEGATIVE
Glucose, UA: NEGATIVE mg/dL
Ketones, POC UA: NEGATIVE mg/dL
Leukocytes, UA: NEGATIVE
Nitrite, UA: NEGATIVE
Spec Grav, UA: >=1.03 — AB (ref 1.010–1.025)
Urobilinogen, UA: 0.2 U/dL
pH, UA: 6 (ref 5.0–8.0)

## 2023-06-14 LAB — POCT INFLUENZA A/B
Influenza A, POC: NEGATIVE
Influenza B, POC: NEGATIVE

## 2023-06-14 MED ORDER — PSEUDOEPH-BROMPHEN-DM 30-2-10 MG/5ML PO SYRP: 2.5000 mL | ORAL_SOLUTION | Freq: Four times a day (QID) | ORAL | 0 refills | Status: AC | PRN

## 2023-06-14 MED ORDER — PREDNISONE 20 MG PO TABS: 40.0000 mg | ORAL_TABLET | Freq: Every day | ORAL | 0 refills | Status: AC

## 2023-06-14 NOTE — ED Provider Notes (Signed)
 [facility]    CSN: [id number] Arrival date & time: 06/14/23  0803      History   Chief Complaint No chief complaint on file.   HPI [name] is a 58 y.o. female.   58 year old female who presents urgent care with complaints of cough, fever, congestion in the nose and chest and generalized bodyaches.  She reports that this started about 3 days ago.  The symptoms have not really gotten better.  She reports running fevers of around 100 and using Tylenol.  She took Tylenol this morning.  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  She also had 1 episode of vomiting.  She reports that she generally just does not feel good.  She also complains of a lot of lower back pain associated with this including some possible irritation with urination and is requesting a urinalysis.     Past Medical History:  Diagnosis Date   Arthritis    Colon polyps    Endometriosis    Fibroid, uterine    GERD (gastroesophageal reflux disease)    H. pylori infection    HLD (hyperlipidemia)     Patient Active Problem List   Diagnosis Date Noted   Status post total replacement of left hip 01/10/2022   Status post total replacement of right hip 07/19/2021   Intramural leiomyoma of uterus 11/05/2020   Encounter for IUD removal 12/18/2019   Fibroids 07/13/2019   Osteoarthritis of multiple joints 03/17/2019   Obesity (BMI 30-39.9) 03/17/2019   Hyperlipidemia 03/17/2019   Family history of clotting disorder 03/17/2019   Endometriosis 03/17/2019   Chronic pelvic pain in female 03/17/2019   Subserous leiomyoma of uterus 03/17/2019   Hx of colonic polyp 07/14/2017   Hx of endometriosis 07/14/2017    Past Surgical History:  Procedure Laterality Date   ABDOMINAL HYSTERECTOMY     LAPAROSCOPY     x 2   TONSILLECTOMY     TOTAL HIP ARTHROPLASTY Right 07/19/2021   Procedure: RIGHT TOTAL HIP ARTHROPLASTY ANTERIOR APPROACH;   Surgeon: Kathryne Hitch, MD;  Location: [facility];  Service: Orthopedics;  Laterality: Right;   TOTAL HIP ARTHROPLASTY Left 01/10/2022   Procedure: LEFT TOTAL HIP ARTHROPLASTY ANTERIOR APPROACH;  Surgeon: Kathryne Hitch, MD;  Location: [facility];  Service: Orthopedics;  Laterality: Left;    OB History   No obstetric history on file.      Home Medications    Prior to Admission medications   Medication Sig Start Date End Date Taking? Authorizing Provider  acetaminophen (TYLENOL) 500 MG tablet Take 500 mg by mouth every 6 (six) hours as needed. 01/21/21  Yes [provider]  LORazepam (ATIVAN) 0.5 MG tablet Take 0.5 mg by mouth every 6 (six) hours as needed. 08/21/22  Yes [provider]  lovastatin (MEVACOR) 20 MG tablet Take 1 tablet by mouth at bedtime. 05/05/22  Yes [provider]  Multiple Vitamin (MULTIVITAMIN) capsule Take 1 capsule by mouth daily. 12/22/18  Yes [provider]  psyllium (REGULOID) 0.52 g capsule Take 0.52 g by mouth daily. 12/22/18  Yes [provider]  saccharomyces boulardii (FLORASTOR) 250 MG capsule Take 250 mg by mouth 2 (two) times daily. 12/22/18  Yes [provider]  acetaminophen (TYLENOL) 500 MG tablet Take 1,000 mg by mouth 3 (three) times daily as needed for moderate pain.    [provider]  azithromycin Christena Deem)  250 MG tablet Take 1 tablet (250 mg total) by mouth daily. Take first 2 tablets together, then 1 every day until finished. 08/24/22   Guy Sandifer L, PA  clotrimazole (LOTRIMIN) 1 % cream Apply to affected area 2 times daily 12/07/22   Gustavus Bryant, FNP  fluconazole (DIFLUCAN) 150 MG tablet Take 1 tablet (150 mg total) by mouth once a week. 12/07/22   Gustavus Bryant, FNP  lovastatin (MEVACOR) 20 MG tablet Take 20 mg by mouth every evening. 04/28/17   [provider]  Multiple Vitamin (MULTIVITAMIN) tablet Take 1 tablet by mouth daily.    [provider]  OVER  THE COUNTER MEDICATION Take 1 capsule by mouth daily. Circulation and Vein Support    [provider]  OVER THE COUNTER MEDICATION Take 1 capsule by mouth daily. Complete Joint Effort    [provider]  OVER THE COUNTER MEDICATION Take 1 tablet by mouth daily. Cholestacare supplement    [provider]  oxyCODONE (OXY IR/ROXICODONE) 5 MG immediate release tablet Take 1-2 tablets (5-10 mg total) by mouth every 6 (six) hours as needed for moderate pain (pain score 4-6). 01/23/22   Kathryne Hitch, MD  Probiotic Product (PHILLIPS COLON HEALTH PO) Take 1 tablet by mouth daily.    [provider]  PSYLLIUM PO Take 1 capsule by mouth daily.    [provider]  zinc gluconate 50 MG tablet Take 50 mg by mouth daily.    [provider]    Family History Family History  Problem Relation Age of Onset   Clotting disorder Mother    Clotting disorder Brother    Colon cancer Paternal Aunt    Heart disease Paternal Uncle    Heart disease Maternal Uncle     Social History Social History   Tobacco Use   Smoking status: Former    Current packs/day: 0.00    Types: Cigarettes    Quit date: 2005    Years since quitting: 20.2   Smokeless tobacco: Never  Vaping Use   Vaping status: Never Used  Substance Use Topics   Alcohol use: Yes    Comment: daily beer   Drug use: Never     Allergies   Patient has no known allergies.   Review of Systems Review of Systems  Constitutional:  Positive for chills and fever.  HENT:  Negative for ear pain and sore throat.   Eyes:  Negative for pain and visual disturbance.  Respiratory:  Positive for cough. Negative for shortness of breath.   Cardiovascular:  Negative for chest pain and palpitations.  Gastrointestinal:  Negative for abdominal pain and vomiting.  Genitourinary:  Positive for dysuria. Negative for hematuria.  Musculoskeletal:  Positive for back pain. Negative for arthralgias.        Body aches  Skin:  Negative for color change and rash.  Neurological:  Negative for seizures and syncope.  All other systems reviewed and are negative.    Physical Exam Triage Vital Signs ED Triage Vitals  Encounter Vitals Group     BP      Systolic BP Percentile      Diastolic BP Percentile      Pulse      Resp      Temp      Temp src      SpO2      Weight      Height      Head Circumference  Peak Flow      Pain Score      Pain Loc      Pain Education      Exclude from Growth Chart    No data found.  Updated Vital Signs LMP 05/28/2012   Visual Acuity Right Eye Distance:   Left Eye Distance:   Bilateral Distance:    Right Eye Near:   Left Eye Near:    Bilateral Near:     Physical Exam Vitals and nursing note reviewed.  Constitutional:      General: She is not in acute distress.    Appearance: She is well-developed.  HENT:     Head: Normocephalic and atraumatic.     Right Ear: Tympanic membrane normal.     Left Ear: Tympanic membrane normal.     Nose: Congestion present.     Mouth/Throat:     Mouth: Mucous membranes are moist.     Pharynx: Posterior oropharyngeal erythema (Very mild) present.  Eyes:     Conjunctiva/sclera: Conjunctivae normal.  Cardiovascular:     Rate and Rhythm: Normal rate and regular rhythm.     Pulses: Normal pulses.     Heart sounds: No murmur heard. Pulmonary:     Effort: Pulmonary effort is normal. No respiratory distress.     Breath sounds: Normal breath sounds.  Abdominal:     Palpations: Abdomen is soft.     Tenderness: There is no abdominal tenderness.  Musculoskeletal:        General: No swelling.     Cervical back: Neck supple.  Skin:    General: Skin is warm and dry.     Capillary Refill: Capillary refill takes less than 2 seconds.  Neurological:     General: No focal deficit present.     Mental Status: She is alert.  Psychiatric:        Mood and Affect: Mood normal.      UC Treatments / Results   Labs (all labs ordered are listed, but only abnormal results are displayed) Labs Reviewed - No data to display  EKG   Radiology No results found.  Procedures Procedures (including critical care time)  Medications Ordered in UC Medications - No data to display  Initial Impression / Assessment and Plan / UC Course  I have reviewed the triage vital signs and the nursing notes.  Pertinent labs & imaging results that were available during my care of the patient were reviewed by me and considered in my medical decision making (see chart for details).     Viral upper respiratory tract infection with cough  Acute cough  Influenza-like illness   Flu A and flu B testing done today and are negative.  Urinalysis done today is negative for infection. Symptoms are most consistent with a viral infection.  This does not require antibiotic treatment.  We focus treatment on improving the symptoms.  We will treat with the following:  Prednisone 40 mg (2 tablets) once daily for 5 days. Take this in the morning.  This is a steroid to help with inflammation and pain. Brompheniramine-pseudoephdrine-DM 2.5 mLs-5 mLs every 8 hours as needed for cough.  Rest and stay hydrated.  Drink plenty of fluids even if you do not feel like taking in solid foods Avoid returning to work until you are 24 hours without a fever, without fever reducing medication Return to urgent care or PCP if symptoms worsen or fail to resolve.    Final Clinical Impressions(s) / UC Diagnoses  Final diagnoses:  Acute cough   Discharge Instructions   None    ED Prescriptions   None    PDMP not reviewed this encounter.   Landis Martins, [place] 06/14/23 [phone]

## 2023-06-14 NOTE — Discharge Instructions (Addendum)
 Flu A and flu B testing done today and are negative.  Urinalysis done today is negative for infection. Symptoms are most consistent with a viral infection.  This does not require antibiotic treatment.  We focus treatment on improving the symptoms.  We will treat with the following:  Prednisone 40 mg (2 tablets) once daily for 5 days. Take this in the morning.  This is a steroid to help with inflammation and pain. Brompheniramine-pseudoephdrine-DM 2.5 mLs-5 mLs every 8 hours as needed for cough.  Rest and stay hydrated.  Drink plenty of fluids even if you do not feel like taking in solid foods Avoid returning to work until you are 24 hours without a fever, without fever reducing medication Return to urgent care or PCP if symptoms worsen or fail to resolve.

## 2023-06-14 NOTE — ED Triage Notes (Signed)
Pt here for cough and congestion with body aches x 3 days  

## 2023-08-20 ENCOUNTER — Encounter: Payer: Self-pay | Admitting: Emergency Medicine

## 2023-08-20 ENCOUNTER — Ambulatory Visit
Admission: EM | Admit: 2023-08-20 | Discharge: 2023-08-20 | Disposition: A | Discharge status: Home / Self Care | Attending: Nurse Practitioner | Admitting: Nurse Practitioner

## 2023-08-20 DIAGNOSIS — R519 Headache, unspecified: Secondary | ICD-10-CM

## 2023-08-20 DIAGNOSIS — M545 Low back pain, unspecified: Secondary | ICD-10-CM | POA: Diagnosis present

## 2023-08-20 DIAGNOSIS — R531 Weakness: Secondary | ICD-10-CM | POA: Diagnosis present

## 2023-08-20 DIAGNOSIS — R110 Nausea: Secondary | ICD-10-CM

## 2023-08-20 DIAGNOSIS — N898 Other specified noninflammatory disorders of vagina: Secondary | ICD-10-CM | POA: Diagnosis present

## 2023-08-20 LAB — POCT URINALYSIS DIP (MANUAL ENTRY)
Bilirubin, UA: NEGATIVE
Blood, UA: NEGATIVE
Glucose, UA: NEGATIVE mg/dL
Ketones, POC UA: NEGATIVE mg/dL
Leukocytes, UA: NEGATIVE
Nitrite, UA: NEGATIVE
Protein Ur, POC: NEGATIVE mg/dL
Spec Grav, UA: 1.01 (ref 1.010–1.025)
Urobilinogen, UA: 0.2 U/dL
pH, UA: 6 (ref 5.0–8.0)

## 2023-08-20 NOTE — ED Triage Notes (Signed)
 Pt is concerned for UTI. Reports intermittent sharp suprapubic pain as well as intermittent aching bilateral flank pain. Denies dysuria, frequency, and urgency.   Pt also notes over a month ago she believes she had a yeast infection that she treated at home herself with monistat and OTC antifungal cream. Pt states those symptoms have resolved aside from occasional vaginal irritation.

## 2023-08-20 NOTE — Discharge Instructions (Signed)
 You were seen today for symptoms of weakness, nausea, lower back pain, headache, and vaginal irritation. Your urinalysis showed no signs of an urinary tract infection. A vaginal swab was collected to test for bacterial vaginosis and yeast infection. You will only be contacted if the results are abnormal; otherwise, you may view your test results through your MyChart account.  At this time, your symptoms can being managed with supportive care. It is important to stay well hydrated by drinking plenty of fluids and to rest as needed. Over-the-counter medications may be used to help relieve discomfort unless otherwise directed.  Please monitor your symptoms closely. You should seek immediate medical attention if you develop a high fever, severe or worsening abdominal or pelvic pain, heavy vaginal bleeding, vomiting that prevents you from keeping fluids down, confusion, weakness, or difficulty urinating. If your symptoms worsen or do not begin to improve within a few days, follow up with your healthcare provider.

## 2023-08-20 NOTE — ED Provider Notes (Signed)
 [facility]    CSN: [id number] Arrival date & time: 08/20/23  1147      History   Chief Complaint Chief Complaint  Patient presents with   Abdominal Pain   Flank Pain    HPI [name] is a 58 y.o. female.   [name] is a 58 y.o. female  with acute onset of gastrointestinal symptoms and lower back pain that began early this morning. She reports waking up around 4:00 AM with stomach discomfort, which she initially attributed to consuming nuts the previous night. The patient describes having to use the bathroom several times, experiencing nausea, headache, and lower back pain. The back pain was diffuse across her entire lower back and improved with some movement. She also reports feeling weak and having shooting, needle-like pains on her right side, as well as discomfort in her suprapubic area. She denies diarrhea, vomiting, fever, or chills, but states she felt like she could have had a fever. Patient reports a history of yeast infection approximately one month ago, which she treated with Monistat. She notes feeling some irritation today but is unsure if it's related to the previous yeast infection or from wiping. The patient denies any current vaginal discharge. She states feeling fine when she went to bed last night and confirms adequate fluid intake. She denies chest pain, shortness of breath, bloating, black or bloody stools, or blood in her urine. The patient reports having a hysterectomy a couple of years ago. During the visit, she performed a self-swab as instructed and noted blood on the swab, which was unexpected given her surgical history  The following portions of the patient's history were reviewed and updated as appropriate: allergies, current medications, past family history, past medical history, past social history, past surgical history, and problem list.    Past Medical History:  Diagnosis Date   Arthritis    Colon polyps    Endometriosis     Fibroid, uterine    GERD (gastroesophageal reflux disease)    H. pylori infection    HLD (hyperlipidemia)     Patient Active Problem List   Diagnosis Date Noted   Status post total replacement of left hip 01/10/2022   Status post total replacement of right hip 07/19/2021   Intramural leiomyoma of uterus 11/05/2020   Encounter for IUD removal 12/18/2019   Fibroids 07/13/2019   Osteoarthritis of multiple joints 03/17/2019   Obesity (BMI 30-39.9) 03/17/2019   Hyperlipidemia 03/17/2019   Family history of clotting disorder 03/17/2019   Endometriosis 03/17/2019   Chronic pelvic pain in female 03/17/2019   Subserous leiomyoma of uterus 03/17/2019   Hx of colonic polyp 07/14/2017   Hx of endometriosis 07/14/2017    Past Surgical History:  Procedure Laterality Date   ABDOMINAL HYSTERECTOMY     LAPAROSCOPY     x 2   TONSILLECTOMY     TOTAL HIP ARTHROPLASTY Right 07/19/2021   Procedure: RIGHT TOTAL HIP ARTHROPLASTY ANTERIOR APPROACH;  Surgeon: Arnie Lao, MD;  Location: [facility];  Service: Orthopedics;  Laterality: Right;   TOTAL HIP ARTHROPLASTY Left 01/10/2022   Procedure: LEFT TOTAL HIP ARTHROPLASTY ANTERIOR APPROACH;  Surgeon: Arnie Lao, MD;  Location: [facility];  Service: Orthopedics;  Laterality: Left;    OB History   No obstetric history on file.      Home Medications    Prior to Admission medications   Medication Sig Start Date End Date Taking? Authorizing Provider  acetaminophen  (TYLENOL ) 500 MG tablet  Take 500 mg by mouth every 6 (six) hours as needed. 01/21/21  Yes [provider]  clotrimazole  (LOTRIMIN ) 1 % cream Apply to affected area 2 times daily 12/07/22  Yes Mound, Haley E, FNP  lovastatin (MEVACOR) 20 MG tablet Take 1 tablet by mouth at bedtime. 05/05/22  Yes [provider]  Multiple Vitamin (MULTIVITAMIN) capsule Take 1 capsule by mouth daily. 12/22/18  Yes [provider]  OVER THE COUNTER MEDICATION Take 1  capsule by mouth daily. Circulation and Vein Support   Yes [provider]  OVER THE COUNTER MEDICATION Take 1 capsule by mouth daily. Complete Joint Effort   Yes [provider]  OVER THE COUNTER MEDICATION Take 1 tablet by mouth daily. Cholestacare supplement   Yes [provider]  psyllium (REGULOID) 0.52 g capsule Take 0.52 g by mouth daily. 12/22/18  Yes [provider]  saccharomyces boulardii (FLORASTOR) 250 MG capsule Take 250 mg by mouth 2 (two) times daily. 12/22/18  Yes [provider]  zinc  gluconate 50 MG tablet Take 50 mg by mouth daily.   Yes [provider]  acetaminophen  (TYLENOL ) 500 MG tablet Take 1,000 mg by mouth 3 (three) times daily as needed for moderate pain.    [provider]  azithromycin  (ZITHROMAX ) 250 MG tablet Take 1 tablet (250 mg total) by mouth daily. Take first 2 tablets together, then 1 every day until finished. Patient not taking: Reported on 06/14/2023 08/24/22   Corita Diego L, PA  brompheniramine-pseudoephedrine-DM 30-2-10 MG/5ML syrup Take 2.5 mLs by mouth 4 (four) times daily as needed. Patient not taking: Reported on 08/20/2023 06/14/23   Kreg Pesa, PA-C  fluconazole  (DIFLUCAN ) 150 MG tablet Take 1 tablet (150 mg total) by mouth once a week. Patient not taking: Reported on 06/14/2023 12/07/22   Mound, Haley E, FNP  LORazepam (ATIVAN) 0.5 MG tablet Take 0.5 mg by mouth every 6 (six) hours as needed. Patient not taking: Reported on 08/20/2023 08/21/22   [provider]  lovastatin (MEVACOR) 20 MG tablet Take 20 mg by mouth every evening. 04/28/17   [provider]  Multiple Vitamin (MULTIVITAMIN) tablet Take 1 tablet by mouth daily.    [provider]  oxyCODONE  (OXY IR/ROXICODONE ) 5 MG immediate release tablet Take 1-2 tablets (5-10 mg total) by mouth every 6 (six) hours as needed for moderate pain (pain score 4-6). Patient not taking: Reported on 08/20/2023 01/23/22    Arnie Lao, MD  Probiotic Product (PHILLIPS COLON HEALTH PO) Take 1 tablet by mouth daily. Patient not taking: Reported on 08/20/2023    [provider]  PSYLLIUM PO Take 1 capsule by mouth daily.    [provider]    Family History Family History  Problem Relation Age of Onset   Clotting disorder Mother    Clotting disorder Brother    Colon cancer Paternal Aunt    Heart disease Paternal Uncle    Heart disease Maternal Uncle     Social History Social History   Tobacco Use   Smoking status: Former    Current packs/day: 0.00    Types: Cigarettes    Quit date: 2005    Years since quitting: 20.4   Smokeless tobacco: Never  Vaping Use   Vaping status: Never Used  Substance Use Topics   Alcohol use: Yes    Comment: daily beer   Drug use: Never     Allergies   Patient has no known allergies.   Review of  Systems Review of Systems  Constitutional:  Negative for chills, diaphoresis, fatigue and fever.  Gastrointestinal:  Positive for abdominal pain (intermittent suprapubic discomfort; not particularly painful). Negative for blood in stool (no black or bloody stools), diarrhea, nausea and vomiting.  Genitourinary:  Positive for flank pain (intermittent, shooting pain). Negative for dysuria, frequency, hematuria, menstrual problem (menopausal s/p hysterectomy), urgency and vaginal discharge.  Musculoskeletal:  Positive for back pain (across lower back).  Neurological:  Positive for weakness (generalized) and headaches (mild).  All other systems reviewed and are negative.    Physical Exam Triage Vital Signs ED Triage Vitals [08/20/23 1213]  Encounter Vitals Group     BP (!) 147/86     Systolic BP Percentile      Diastolic BP Percentile      Pulse Rate 91     Resp 16     Temp 98.1 F (36.7 C)     Temp Source Oral     SpO2 96 %     Weight      Height      Head Circumference      Peak Flow      Pain Score 4     Pain Loc      Pain  Education      Exclude from Growth Chart    No data found.  Updated Vital Signs BP (!) 147/86 (BP Location: Left Arm)   Pulse 91   Temp 98.1 F (36.7 C) (Oral)   Resp 16   LMP 05/28/2012   SpO2 96%   Visual Acuity Right Eye Distance:   Left Eye Distance:   Bilateral Distance:    Right Eye Near:   Left Eye Near:    Bilateral Near:     Physical Exam Vitals and nursing note reviewed. Exam conducted with a chaperone present.  Constitutional:      General: She is awake. She is not in acute distress.    Appearance: Normal appearance. She is well-developed. She is not ill-appearing or toxic-appearing.  HENT:     Head: Normocephalic.     Mouth/Throat:     Mouth: Mucous membranes are moist.  Eyes:     Conjunctiva/sclera: Conjunctivae normal.  Cardiovascular:     Rate and Rhythm: Normal rate and regular rhythm.     Heart sounds: Normal heart sounds.  Pulmonary:     Effort: Pulmonary effort is normal.     Breath sounds: Normal breath sounds.  Genitourinary:    General: Normal vulva.     Labia:        Right: No tenderness, lesion or injury.        Left: No tenderness, lesion or injury.      Vagina: Bleeding (very scant blood noted within the vaginal vault without clots or active bleeding) present. No vaginal discharge.     Comments: Patient performed vaginal swab for Aptima testing prior to vaginal exam by provider Musculoskeletal:        General: Normal range of motion.     Cervical back: Full passive range of motion without pain, normal range of motion and neck supple.  Skin:    General: Skin is warm and dry.  Neurological:     General: No focal deficit present.     Mental Status: She is alert and oriented to person, place, and time.  Psychiatric:        Behavior: Behavior is cooperative.      UC Treatments / Results  Labs (all labs ordered  are listed, but only abnormal results are displayed) Labs Reviewed  POCT URINALYSIS DIP (MANUAL ENTRY)  CERVICOVAGINAL  ANCILLARY ONLY    EKG   Radiology No results found.  Procedures Procedures (including critical care time)  Medications Ordered in UC Medications - No data to display  Initial Impression / Assessment and Plan / UC Course  I have reviewed the triage vital signs and the nursing notes.  Pertinent labs & imaging results that were available during my care of the patient were reviewed by me and considered in my medical decision making (see chart for details).     Patient is alert, oriented, afebrile, and nontoxic. Urinalysis was unremarkable. A vaginal exam was performed after the patient noted unexpected blood on the Aptima self-swab. Exam revealed a scant amount of blood in the vaginal vault without active bleeding, clots, discharge, or visible injury. No other abnormalities were noted. Patient denies recent sexual activity and expressed reassurance after the exam. She reported being postmenopausal with vaginal dryness and believes she may have caused minor trauma during self-swabbing. Given her history of a yeast infection one month ago and current mild irritation, samples were sent for bacterial vaginosis and yeast testing. No focal findings were identified to suggest acute infection or injury. Patient was advised to stay well hydrated, rest, and monitor symptoms. Indications for immediate ED evaluation and PCP follow-up were reviewed.  Today's evaluation has revealed no signs of a dangerous process. Discussed diagnosis with patient and/or guardian. Patient and/or guardian aware of their diagnosis, possible red flag symptoms to watch out for and need for close follow up. Patient and/or guardian understands verbal and written discharge instructions. Patient and/or guardian comfortable with plan and disposition.  Patient and/or guardian has a clear mental status at this time, good insight into illness (after discussion and teaching) and has clear judgment to make decisions regarding their  care  Documentation was completed with the aid of voice recognition software. Transcription may contain typographical errors.  Final Clinical Impressions(s) / UC Diagnoses   Final diagnoses:  Vaginal irritation  Nausea without vomiting  Acute nonintractable headache, unspecified headache type  Acute low back pain without sciatica, unspecified back pain laterality  Weakness     Discharge Instructions      You were seen today for symptoms of weakness, nausea, lower back pain, headache, and vaginal irritation. Your urinalysis showed no signs of an urinary tract infection. A vaginal swab was collected to test for bacterial vaginosis and yeast infection. You will only be contacted if the results are abnormal; otherwise, you may view your test results through your MyChart account.  At this time, your symptoms can being managed with supportive care. It is important to stay well hydrated by drinking plenty of fluids and to rest as needed. Over-the-counter medications may be used to help relieve discomfort unless otherwise directed.  Please monitor your symptoms closely. You should seek immediate medical attention if you develop a high fever, severe or worsening abdominal or pelvic pain, heavy vaginal bleeding, vomiting that prevents you from keeping fluids down, confusion, weakness, or difficulty urinating. If your symptoms worsen or do not begin to improve within a few days, follow up with your healthcare provider.    ED Prescriptions   None    PDMP not reviewed this encounter.   Maryruth Sol, [place] 08/20/23 1319

## 2023-08-21 LAB — CERVICOVAGINAL ANCILLARY ONLY
Bacterial Vaginitis (gardnerella): NEGATIVE
Candida Glabrata: NEGATIVE
Candida Vaginitis: NEGATIVE
Comment: NEGATIVE
Comment: NEGATIVE
Comment: NEGATIVE

## 2024-02-17 ENCOUNTER — Emergency Department (HOSPITAL_COMMUNITY)

## 2024-02-17 ENCOUNTER — Encounter (HOSPITAL_COMMUNITY): Payer: Self-pay | Admitting: Emergency Medicine

## 2024-02-17 ENCOUNTER — Other Ambulatory Visit: Payer: Self-pay

## 2024-02-17 ENCOUNTER — Emergency Department (HOSPITAL_COMMUNITY)
Admission: EM | Admit: 2024-02-17 | Discharge: 2024-02-18 | Disposition: A | Discharge status: Home / Self Care | Attending: Emergency Medicine | Admitting: Emergency Medicine

## 2024-02-17 DIAGNOSIS — K2970 Gastritis, unspecified, without bleeding: Secondary | ICD-10-CM | POA: Diagnosis not present

## 2024-02-17 DIAGNOSIS — R1084 Generalized abdominal pain: Secondary | ICD-10-CM | POA: Diagnosis present

## 2024-02-17 LAB — CBC WITH DIFFERENTIAL/PLATELET
Abs Immature Granulocytes: 0.02 K/uL (ref 0.00–0.07)
Basophils Absolute: 0 K/uL (ref 0.0–0.1)
Basophils Relative: 1 %
Eosinophils Absolute: 0.5 K/uL (ref 0.0–0.5)
Eosinophils Relative: 6 %
HCT: 43.4 % (ref 36.0–46.0)
Hemoglobin: 14.3 g/dL (ref 12.0–15.0)
Immature Granulocytes: 0 %
Lymphocytes Relative: 41 %
Lymphs Abs: 2.9 K/uL (ref 0.7–4.0)
MCH: 30.8 pg (ref 26.0–34.0)
MCHC: 32.9 g/dL (ref 30.0–36.0)
MCV: 93.3 fL (ref 80.0–100.0)
Monocytes Absolute: 0.6 K/uL (ref 0.1–1.0)
Monocytes Relative: 8 %
Neutro Abs: 3.2 K/uL (ref 1.7–7.7)
Neutrophils Relative %: 44 %
Platelets: 240 K/uL (ref 150–400)
RBC: 4.65 MIL/uL (ref 3.87–5.11)
RDW: 12.4 % (ref 11.5–15.5)
WBC: 7.1 K/uL (ref 4.0–10.5)
nRBC: 0 % (ref 0.0–0.2)

## 2024-02-17 LAB — COMPREHENSIVE METABOLIC PANEL WITH GFR
ALT: 32 U/L (ref 0–44)
AST: 34 U/L (ref 15–41)
Albumin: 4.2 g/dL (ref 3.5–5.0)
Alkaline Phosphatase: 85 U/L (ref 38–126)
Anion gap: 13 (ref 5–15)
BUN: 11 mg/dL (ref 6–20)
CO2: 23 mmol/L (ref 22–32)
Calcium: 9.5 mg/dL (ref 8.9–10.3)
Chloride: 105 mmol/L (ref 98–111)
Creatinine, Ser: 0.73 mg/dL (ref 0.44–1.00)
GFR, Estimated: >60 mL/min (ref >60)
Glucose, Bld: 93 mg/dL (ref 70–99)
Potassium: 3.9 mmol/L (ref 3.5–5.1)
Sodium: 141 mmol/L (ref 135–145)
Total Bilirubin: 0.5 mg/dL (ref 0.0–1.2)
Total Protein: 7.4 g/dL (ref 6.5–8.1)

## 2024-02-17 LAB — TROPONIN I (HIGH SENSITIVITY)
Troponin I (High Sensitivity): 16 ng/L (ref <18)
Troponin I (High Sensitivity): 17 ng/L (ref <18)

## 2024-02-17 NOTE — ED Provider Triage Note (Signed)
 Emergency Medicine Provider Triage Evaluation Note  [name] , a 58 y.o. female  was evaluated in triage.  Pt complains of HTN.  Review of Systems  Positive: Elevated blood pressure, esophageal spasming, acid reflux, left leg heaviness, HA Negative: Fever, chills, nausea, vomiting, CP, SHOB  Physical Exam  BP (!) 181/88 (BP Location: Left Arm)   Pulse 97   Temp 98 F (36.7 C)   Resp 18   Ht 5' 2 (1.575 m)   Wt 90 kg   LMP 05/28/2012   SpO2 100%   BMI 36.29 kg/m  Gen:   Awake, no distress, anxious appearing Resp:  Normal effort  MSK:   Moves extremities without difficulty  Other:    Medical Decision Making  Medically screening exam initiated at 7:46 PM.  Appropriate orders placed.  [name] was informed that the remainder of the evaluation will be completed by another provider, this initial triage assessment does not replace that evaluation, and the importance of remaining in the ED until their evaluation is complete.  Labs and imaging ordered   [name] 02/17/24 [id number]

## 2024-02-17 NOTE — ED Triage Notes (Signed)
 Patient reports high BP readings, headache, esophageal spasms, and left hip numbness today. Patient ambulatory without difficulty to registration.

## 2024-02-17 NOTE — ED Triage Notes (Signed)
 Patient from home with esophageal spasming, acid reflux, she tried to take famotadine with no relief. Endorses high blood pressure readings while at home saying well I know the top number was over 160 and the bottom was over 100. She is endorsing that her left leg is feeling heavy which she noted tonight while taking her BP. Denies any swelling.

## 2024-02-18 ENCOUNTER — Emergency Department (HOSPITAL_COMMUNITY)

## 2024-02-18 MED ORDER — HYOSCYAMINE SULFATE 0.125 MG SL SUBL
0.2500 mg | SUBLINGUAL_TABLET | Freq: Once | SUBLINGUAL | Status: AC
  Administered 2024-02-18: 0.25 mg via ORAL
  Filled 2024-02-18: qty 2

## 2024-02-18 MED ORDER — ALUM & MAG HYDROXIDE-SIMETH 200-200-20 MG/5ML PO SUSP
30.0000 mL | Freq: Once | ORAL | Status: AC
  Administered 2024-02-18: 30 mL via ORAL
  Filled 2024-02-18: qty 30

## 2024-02-18 MED ORDER — IOHEXOL 350 MG/ML SOLN
75.0000 mL | Freq: Once | INTRAVENOUS | Status: AC | PRN
  Administered 2024-02-18: 75 mL via INTRAVENOUS

## 2024-02-18 MED ORDER — PANTOPRAZOLE SODIUM 40 MG PO TBEC
40.0000 mg | DELAYED_RELEASE_TABLET | Freq: Every day | ORAL | 3 refills | Status: AC
Start: 2024-02-18 — End: ?

## 2024-02-18 MED ORDER — SUCRALFATE 1 G PO TABS: 1.0000 g | ORAL_TABLET | Freq: Three times a day (TID) | ORAL | 0 refills | Status: AC

## 2024-02-18 NOTE — ED Provider Notes (Signed)
 [place] EMERGENCY DEPARTMENT AT [facility] Provider Note   CSN: [id number] Arrival date & time: 02/17/24  8069     Patient presents with: Hypertension   [name] is a 58 y.o. female.   Patient presents to the emergency department primarily for elevated blood pressure.  Patient reports that she has not been feeling well all day.  She has been having abdominal pain radiating up into the chest.  She reports that she has a known hiatal hernia and has been diagnosed with esophageal spasms in the past.  She thought her pain was secondary to the esophageal spasms, however she checked her blood pressure and it was very elevated at home.  She does not have history of high blood pressure.  In addition, patient has been having some intermittent pain in the left lower quadrant for the last month.  She is concerned about the possibility of diverticulitis.       Prior to Admission medications   Medication Sig Start Date End Date Taking? Authorizing Provider  pantoprazole  (PROTONIX ) 40 MG tablet Take 1 tablet (40 mg total) by mouth daily. 02/18/24  Yes Orella Cushman, Lonni PARAS, MD  sucralfate (CARAFATE) 1 g tablet Take 1 tablet (1 g total) by mouth 4 (four) times daily -  with meals and at bedtime. 02/18/24  Yes Cathe Bilger, Lonni PARAS, MD  acetaminophen  (TYLENOL ) 500 MG tablet Take 1,000 mg by mouth 3 (three) times daily as needed for moderate pain.    [provider]  acetaminophen  (TYLENOL ) 500 MG tablet Take 500 mg by mouth every 6 (six) hours as needed. 01/21/21   [provider]  azithromycin  (ZITHROMAX ) 250 MG tablet Take 1 tablet (250 mg total) by mouth daily. Take first 2 tablets together, then 1 every day until finished. Patient not taking: Reported on 06/14/2023 08/24/22   Lowella Folks L, PA  brompheniramine-pseudoephedrine-DM 30-2-10 MG/5ML syrup Take 2.5 mLs by mouth 4 (four) times daily as needed. Patient not taking: Reported on 08/20/2023 06/14/23    Teresa Norris A, PA-C  clotrimazole  (LOTRIMIN ) 1 % cream Apply to affected area 2 times daily 12/07/22   Hazen Darryle BRAVO, FNP  fluconazole  (DIFLUCAN ) 150 MG tablet Take 1 tablet (150 mg total) by mouth once a week. Patient not taking: Reported on 06/14/2023 12/07/22   Mound, Haley E, FNP  LORazepam (ATIVAN) 0.5 MG tablet Take 0.5 mg by mouth every 6 (six) hours as needed. Patient not taking: Reported on 08/20/2023 08/21/22   [provider]  lovastatin (MEVACOR) 20 MG tablet Take 20 mg by mouth every evening. 04/28/17   [provider]  lovastatin (MEVACOR) 20 MG tablet Take 1 tablet by mouth at bedtime. 05/05/22   [provider]  Multiple Vitamin (MULTIVITAMIN) capsule Take 1 capsule by mouth daily. 12/22/18   [provider]  Multiple Vitamin (MULTIVITAMIN) tablet Take 1 tablet by mouth daily.    [provider]  OVER THE COUNTER MEDICATION Take 1 capsule by mouth daily. Circulation and Vein Support    [provider]  OVER THE COUNTER MEDICATION Take 1 capsule by mouth daily. Complete Joint Effort    [provider]  OVER THE COUNTER MEDICATION Take 1 tablet by mouth daily. Cholestacare supplement    [provider]  oxyCODONE  (OXY IR/ROXICODONE ) 5 MG immediate release tablet Take 1-2 tablets (5-10 mg total) by mouth every 6 (six) hours as needed for moderate pain (pain score 4-6). Patient not taking: Reported on 08/20/2023 01/23/22  Vernetta Lonni GRADE, MD  Probiotic Product (PHILLIPS COLON HEALTH PO) Take 1 tablet by mouth daily. Patient not taking: Reported on 08/20/2023    [provider]  psyllium (REGULOID) 0.52 g capsule Take 0.52 g by mouth daily. 12/22/18   [provider]  PSYLLIUM PO Take 1 capsule by mouth daily.    [provider]  saccharomyces boulardii (FLORASTOR) 250 MG capsule Take 250 mg by mouth 2 (two) times daily. 12/22/18   [provider]  zinc  gluconate 50 MG tablet  Take 50 mg by mouth daily.    [provider]    Allergies: Patient has no known allergies.    Review of Systems  Updated Vital Signs BP 130/73   Pulse 71   Temp 97.9 F (36.6 C) (Oral)   Resp 17   Ht 5' 2 (1.575 m)   Wt 90 kg   LMP 05/28/2012   SpO2 100%   BMI 36.29 kg/m   Physical Exam Vitals and nursing note reviewed.  Constitutional:      General: She is not in acute distress.    Appearance: She is well-developed.  HENT:     Head: Normocephalic and atraumatic.     Mouth/Throat:     Mouth: Mucous membranes are moist.  Eyes:     General: Vision grossly intact. Gaze aligned appropriately.     Extraocular Movements: Extraocular movements intact.     Conjunctiva/sclera: Conjunctivae normal.  Cardiovascular:     Rate and Rhythm: Normal rate and regular rhythm.     Pulses: Normal pulses.     Heart sounds: Normal heart sounds, S1 normal and S2 normal. No murmur heard.    No friction rub. No gallop.  Pulmonary:     Effort: Pulmonary effort is normal. No respiratory distress.     Breath sounds: Normal breath sounds.  Abdominal:     General: Bowel sounds are normal.     Palpations: Abdomen is soft.     Tenderness: There is generalized abdominal tenderness. There is no guarding or rebound.     Hernia: No hernia is present.  Musculoskeletal:        General: No swelling.     Cervical back: Full passive range of motion without pain, normal range of motion and neck supple. No spinous process tenderness or muscular tenderness. Normal range of motion.     Right lower leg: No edema.     Left lower leg: No edema.  Skin:    General: Skin is warm and dry.     Capillary Refill: Capillary refill takes less than 2 seconds.     Findings: No ecchymosis, erythema, rash or wound.  Neurological:     General: No focal deficit present.     Mental Status: She is alert and oriented to person, place, and time.     GCS: GCS eye subscore is 4. GCS verbal subscore is 5. GCS motor  subscore is 6.     Cranial Nerves: Cranial nerves 2-12 are intact.     Sensory: Sensation is intact.     Motor: Motor function is intact.     Coordination: Coordination is intact.  Psychiatric:        Attention and Perception: Attention normal.        Mood and Affect: Mood normal.        Speech: Speech normal.        Behavior: Behavior normal.     (all labs ordered are listed, but only abnormal results  are displayed) Labs Reviewed  COMPREHENSIVE METABOLIC PANEL WITH GFR  CBC WITH DIFFERENTIAL/PLATELET  TROPONIN I (HIGH SENSITIVITY)  TROPONIN I (HIGH SENSITIVITY)    EKG: EKG Interpretation Date/Time:  Wednesday February 17 2024 19:54:22 EST Ventricular Rate:  87 PR Interval:  138 QRS Duration:  80 QT Interval:  354 QTC Calculation: 425 R Axis:   23  Text Interpretation: Normal sinus rhythm Minimal voltage criteria for LVH, may be normal variant ( R in aVL ) Cannot rule out Anterior infarct , age undetermined Abnormal ECG No previous ECGs available Confirmed by Raford Lenis ([id number]) on 02/17/2024 11:57:16 PM  Radiology: CT ABDOMEN PELVIS W CONTRAST Result Date: 02/18/2024 EXAM: CT ABDOMEN AND PELVIS WITH CONTRAST 02/18/2024 04:03:15 AM TECHNIQUE: CT of the abdomen and pelvis was performed with the administration of 75 mL of iohexol (OMNIPAQUE) 350 MG/ML injection. Multiplanar reformatted images are provided for review. Automated exposure control, iterative reconstruction, and/or weight-based adjustment of the mA/kV was utilized to reduce the radiation dose to as low as reasonably achievable. COMPARISON: 08/24/2008 CLINICAL HISTORY: Abdominal pain, acute, nonlocalized. FINDINGS: LOWER CHEST: No acute abnormality. LIVER: Simple-Appearing Liver Cyst within segment 8/4 measures 1.3 cm. GALLBLADDER AND BILE DUCTS: Gallbladder is unremarkable. No biliary ductal dilatation. SPLEEN: The spleen is within normal limits in size and appearance. PANCREAS: The pancreas is normal in size and  contour without focal lesion or ductal dilatation. ADRENAL GLANDS: Right Adrenal Nodule measures 0.8 cm and 26 HU. This likely represents normal left adrenal gland. KIDNEYS, URETERS AND BLADDER: Bosniak Class 2 Cyst within the posterior cortex of the left kidney measures 7 mm. Per consensus, no follow-up is needed for simple Bosniak type 1 and 2 renal cysts, unless the patient has a malignancy history or risk factors. No stones in the kidneys or ureters. No hydronephrosis. No perinephric or periureteral stranding. Urinary bladder is unremarkable. GI AND BOWEL: Stomach demonstrates no acute abnormality. The appendix is visualized and normal in caliber, without wall thickening, periappendiceal inflammation, or fluid. Colonic Diverticulosis without signs of acute diverticulitis. There is no bowel obstruction. PERITONEUM AND RETROPERITONEUM: No ascites. No free air. VASCULATURE: Aorta is normal in caliber. Aortic Atherosclerotic Calcifications. LYMPH NODES: No lymphadenopathy. REPRODUCTIVE ORGANS: Status Post Hysterectomy. No adnexal mass. BONES AND SOFT TISSUES: Status Post Bilateral Hip Arthroplasty. Lumbar Degenerative Disc Disease. No acute osseous abnormality. No focal soft tissue abnormality. IMPRESSION: 1. No acute findings in the abdomen or pelvis. 2. Incidental right adrenal nodule measuring 8 mm and 26 hounsfield units. Normal size and morphology bilaterally. No nodule, thickening, or hemorrhage. This is probably a benign adenoma. According to consensus criteria, no follow-up imaging is recommended. 3. Aortic atherosclerotic calcifications. Electronically signed by: Waddell Calk MD 02/18/2024 05:32 AM EST RP Workstation: [id number]   DG Chest 2 View Result Date: 02/17/2024 EXAM: 2 VIEW(S) XRAY OF THE CHEST 02/17/2024 08:10:47 PM COMPARISON: chest x-ray 08/24/2022 CLINICAL HISTORY: HTN FINDINGS: LUNGS AND PLEURA: No focal pulmonary opacity. No pleural effusion. No pneumothorax. HEART AND MEDIASTINUM: No  acute abnormality of the cardiac and mediastinal silhouettes. BONES AND SOFT TISSUES: Thoracolumbar dextroscoliosis and multilevel thoracic degenerative discs. No acute osseous abnormality. IMPRESSION: 1. No acute cardiopulmonary process. Electronically signed by: Morgane Naveau MD 02/17/2024 08:38 PM EST RP Workstation: [id number]     Procedures   Medications Ordered in the ED  alum & mag hydroxide-simeth (MAALOX/MYLANTA) 200-200-20 MG/5ML suspension 30 mL (30 mLs Oral Given 02/18/24 0330)  hyoscyamine (LEVSIN SL) SL tablet 0.25 mg (0.25 mg Oral Given 02/18/24 0330)  iohexol (  OMNIPAQUE) 350 MG/ML injection 75 mL (75 mLs Intravenous Contrast Given 02/18/24 0403)                                    Medical Decision Making Amount and/or Complexity of Data Reviewed Radiology: ordered.  Risk OTC drugs. Prescription drug management.   Differential Diagnosis considered includes, but not limited to: STEMI; NSTEMI; myocarditis; pericarditis; pulmonary embolism; aortic dissection; pneumothorax; pneumonia; gastritis; musculoskeletal pain   Presents to the emergency department for evaluation of abdominal pain, chest pain and elevated blood pressure.  Patient reports a history of intermittent GI episodes that have been attributed to hiatal hernia and esophageal spasms in the past.  She had symptoms that she thought were simple indigestion but then the symptoms worsened and she checked her blood pressure which was elevated.  She does not normally have a high blood pressure.  Blood pressure is self-correcting, slightly elevated here.  Cardiac evaluation is unremarkable and symptoms are atypical for cardiac etiology.  CT scan was performed to further evaluate for intra-abdominal pathology.  No acute abnormalities are noted.  Will initiate Carafate, PPI and follow-up with primary care.  Given return precautions.     Final diagnoses:  Gastritis without bleeding, unspecified chronicity,  unspecified gastritis type    ED Discharge Orders          Ordered    pantoprazole  (PROTONIX ) 40 MG tablet  Daily        02/18/24 0545    sucralfate (CARAFATE) 1 g tablet  3 times daily with meals & bedtime        02/18/24 0545               Haze Lonni PARAS, MD 02/18/24 [phone]
